# Patient Record
Sex: Female | Born: 1985 | Race: Black or African American | Hispanic: No | Marital: Married | Smoking: Never smoker
Health system: Southern US, Community
[De-identification: ages and names within clinical notes are randomized; demographics above are authoritative.]

## PROBLEM LIST (undated history)

## (undated) DIAGNOSIS — N390 Urinary tract infection, site not specified: Secondary | ICD-10-CM

## (undated) DIAGNOSIS — N760 Acute vaginitis: Secondary | ICD-10-CM

## (undated) DIAGNOSIS — B373 Candidiasis of vulva and vagina: Secondary | ICD-10-CM

## (undated) DIAGNOSIS — R51 Headache: Secondary | ICD-10-CM

## (undated) DIAGNOSIS — B9689 Other specified bacterial agents as the cause of diseases classified elsewhere: Secondary | ICD-10-CM

## (undated) DIAGNOSIS — B3731 Acute candidiasis of vulva and vagina: Secondary | ICD-10-CM

## (undated) HISTORY — PX: COLONOSCOPY: SHX174

## (undated) HISTORY — PX: NO PAST SURGERIES: SHX2092

---
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
## 2005-08-07 ENCOUNTER — Ambulatory Visit: Payer: Self-pay | Admitting: Family Medicine

## 2005-08-20 ENCOUNTER — Ambulatory Visit: Payer: Self-pay | Admitting: Family Medicine

## 2005-08-28 ENCOUNTER — Emergency Department (HOSPITAL_COMMUNITY): Admission: EM | Admit: 2005-08-28 | Discharge: 2005-08-29 | Payer: Self-pay | Admitting: Emergency Medicine

## 2005-10-12 ENCOUNTER — Other Ambulatory Visit: Admission: RE | Admit: 2005-10-12 | Discharge: 2005-10-12 | Payer: Self-pay | Admitting: Obstetrics and Gynecology

## 2006-01-16 ENCOUNTER — Inpatient Hospital Stay (HOSPITAL_COMMUNITY): Admission: AD | Admit: 2006-01-16 | Discharge: 2006-01-16 | Payer: Self-pay | Admitting: Obstetrics and Gynecology

## 2006-03-16 ENCOUNTER — Inpatient Hospital Stay (HOSPITAL_COMMUNITY): Admission: AD | Admit: 2006-03-16 | Discharge: 2006-03-16 | Payer: Self-pay | Admitting: Obstetrics and Gynecology

## 2006-03-19 ENCOUNTER — Inpatient Hospital Stay (HOSPITAL_COMMUNITY): Admission: AD | Admit: 2006-03-19 | Discharge: 2006-03-19 | Payer: Self-pay | Admitting: Obstetrics and Gynecology

## 2006-03-25 ENCOUNTER — Inpatient Hospital Stay (HOSPITAL_COMMUNITY): Admission: AD | Admit: 2006-03-25 | Discharge: 2006-03-27 | Payer: Self-pay | Admitting: Obstetrics and Gynecology

## 2006-08-30 ENCOUNTER — Emergency Department (HOSPITAL_COMMUNITY): Admission: EM | Admit: 2006-08-30 | Discharge: 2006-08-31 | Payer: Self-pay | Admitting: Emergency Medicine

## 2008-02-03 IMAGING — CR DG CHEST 2V
2 series · 2 of 2 positions shown · non-contrast
Comparison: None.

CLINICAL DATA: Third trimester pregnancy.  Headache, cough, chest pain, nausea, and vomiting.
 CHEST ? 2 VIEW:

[view not recorded (1 of 2)]
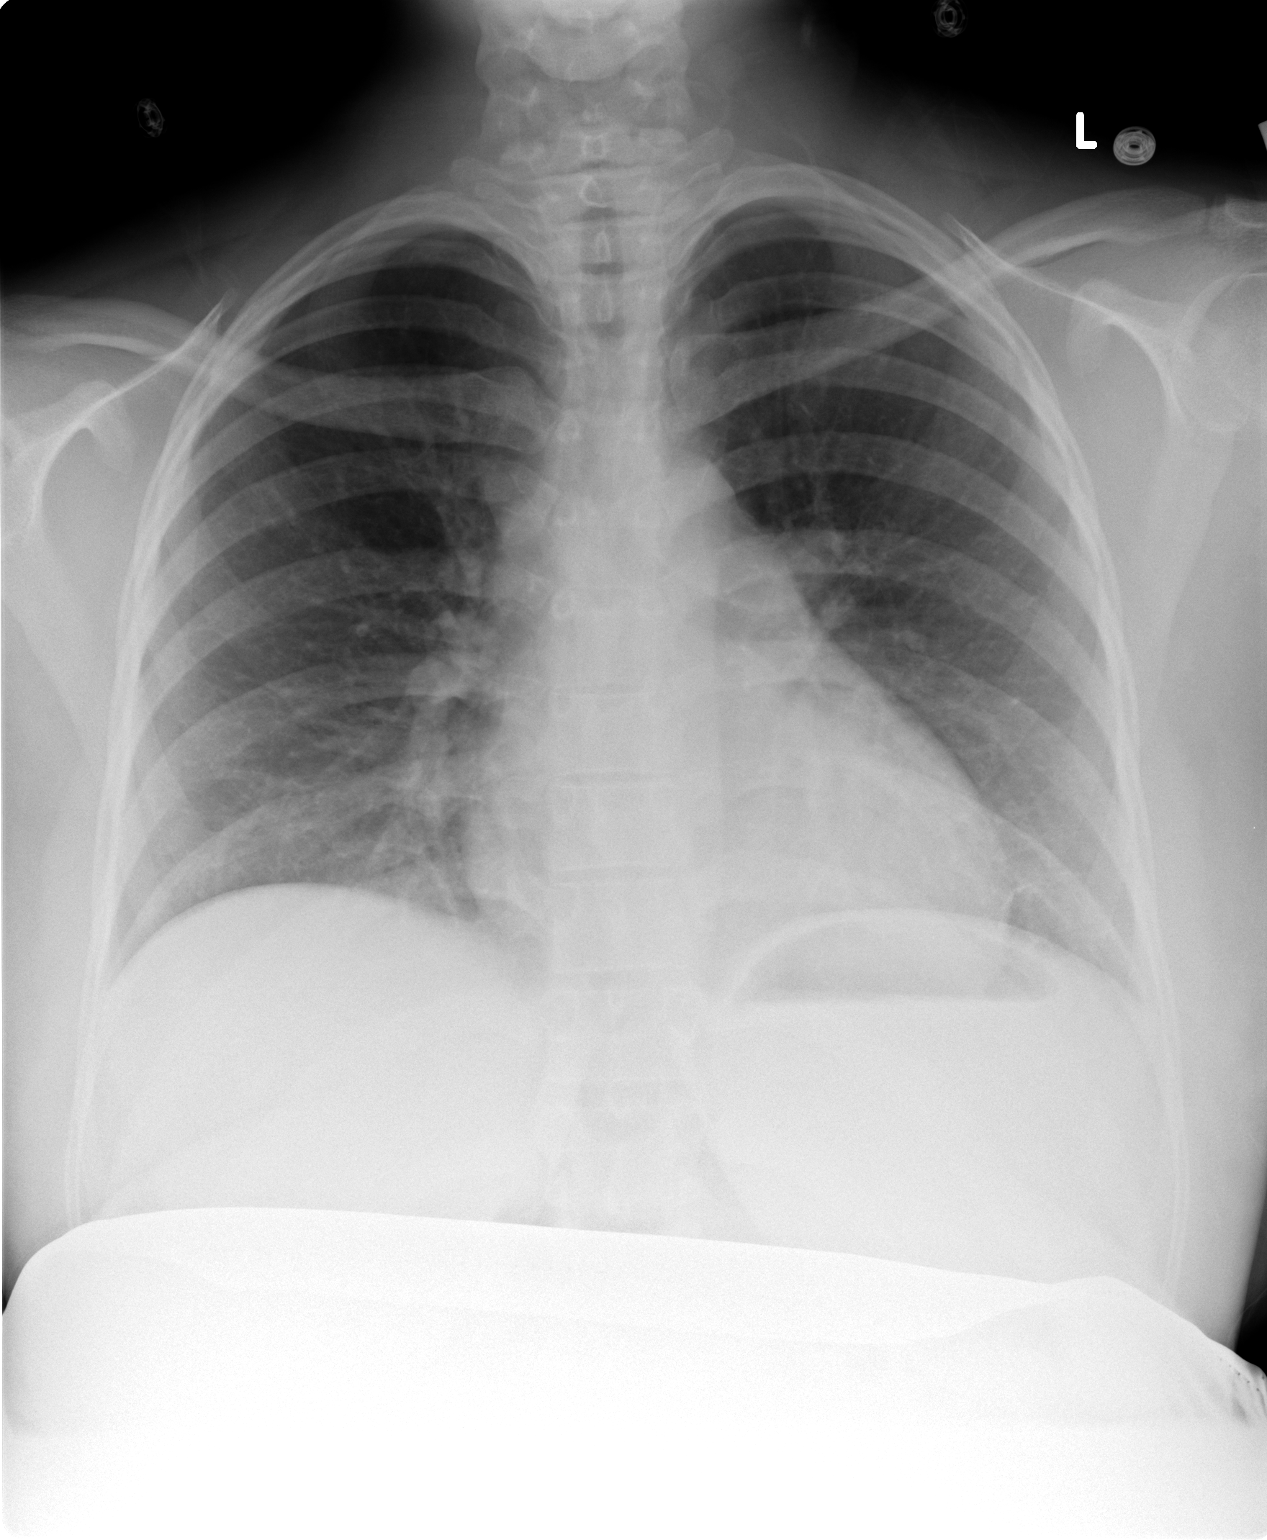

[view not recorded (2 of 2)]
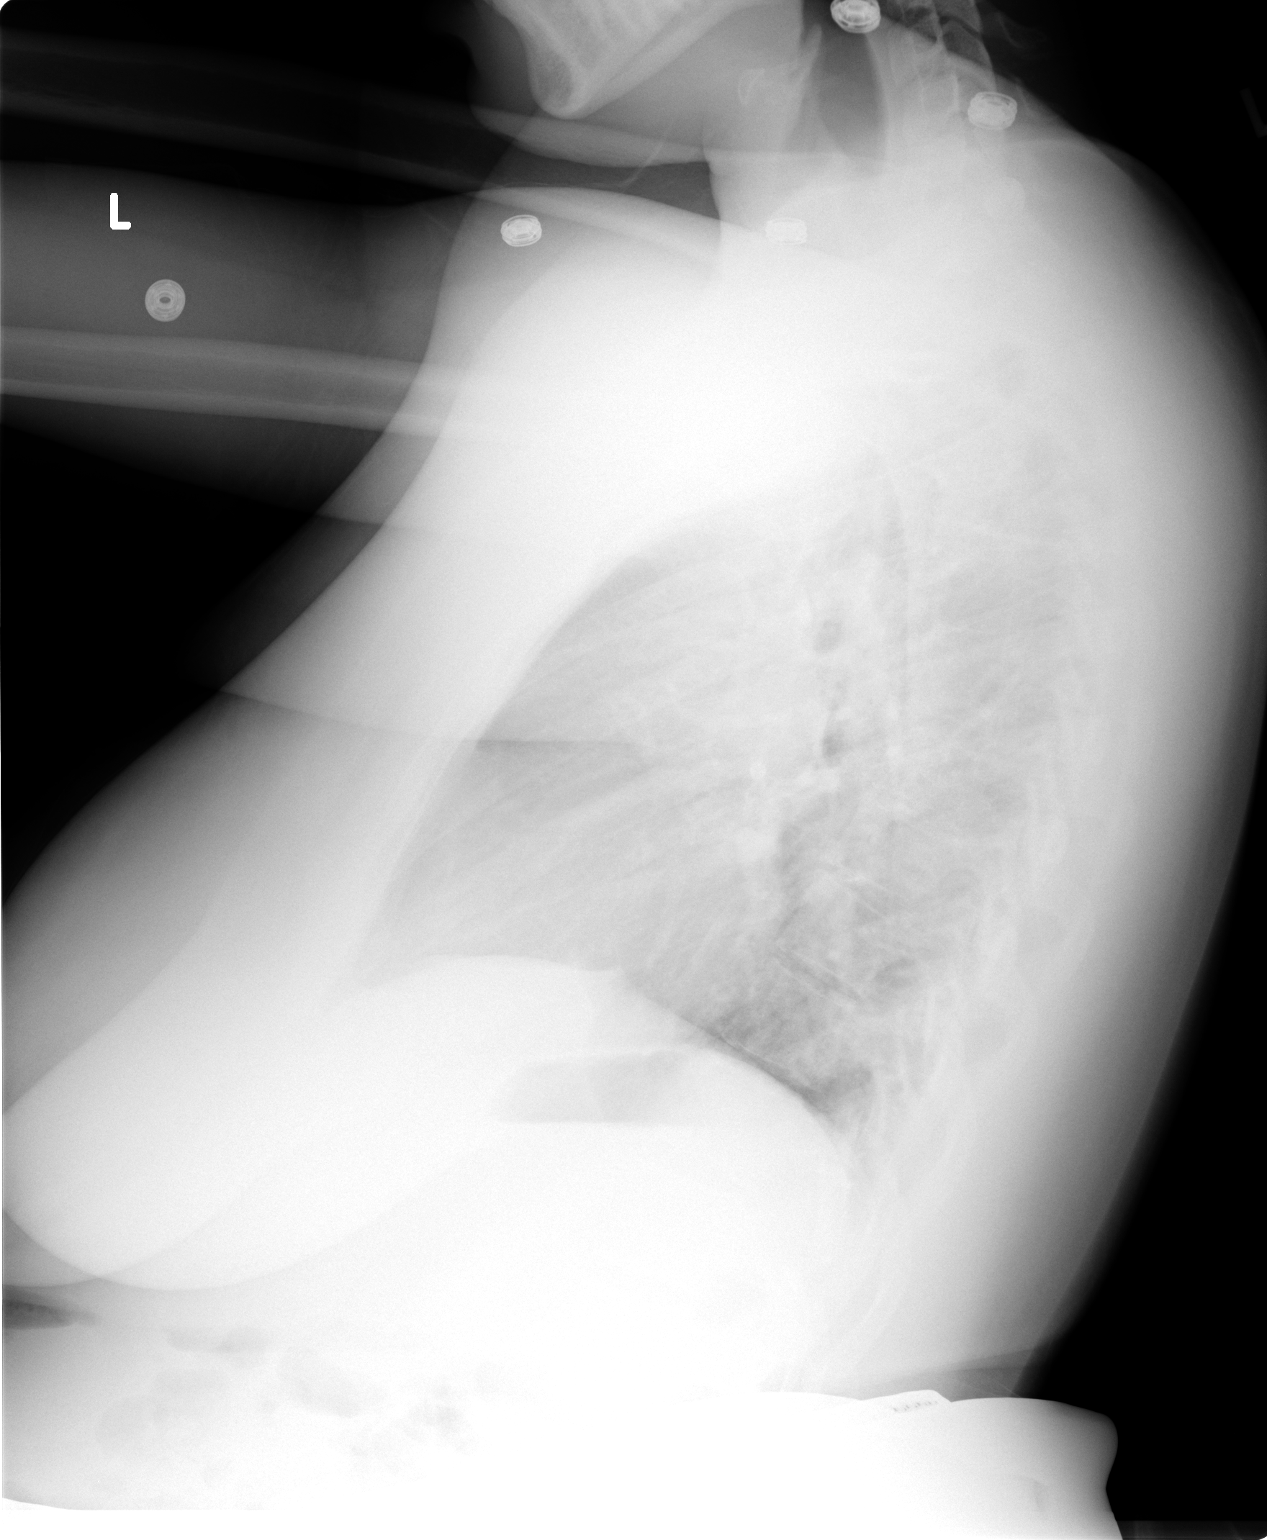

[2 of 2 positions shown; findings below may reference images not displayed]

FINDINGS: The patient was shielded for examination.  
 Heart size is normal.  The mediastinum is unremarkable.  The lungs are clear.  No effusions.  Bony structures appear normal.
IMPRESSION: Normal chest.

## 2008-12-21 ENCOUNTER — Emergency Department (HOSPITAL_COMMUNITY): Admission: EM | Admit: 2008-12-21 | Discharge: 2008-12-21 | Payer: Self-pay | Admitting: Emergency Medicine

## 2009-04-15 ENCOUNTER — Emergency Department (HOSPITAL_COMMUNITY): Admission: EM | Admit: 2009-04-15 | Discharge: 2009-04-15 | Payer: Self-pay | Admitting: Emergency Medicine

## 2009-04-17 ENCOUNTER — Emergency Department (HOSPITAL_COMMUNITY): Admission: EM | Admit: 2009-04-17 | Discharge: 2009-04-17 | Payer: Self-pay | Admitting: Emergency Medicine

## 2010-05-10 LAB — RAPID STREP SCREEN (MED CTR MEBANE ONLY): Streptococcus, Group A Screen (Direct): NEGATIVE

## 2010-05-24 LAB — RAPID STREP SCREEN (MED CTR MEBANE ONLY): Streptococcus, Group A Screen (Direct): NEGATIVE

## 2010-07-07 NOTE — H&P (Signed)
NAMESHYLA, GAYHEART             ACCOUNT NO.:  [id number]   MEDICAL RECORD NO.:  [id number]          PATIENT TYPE:  INP   LOCATION:  9198                          FACILITY:  [facility]   PHYSICIAN:  Crist Fat. Rivard, M.D. DATE OF BIRTH:  06-Dec-1985   DATE OF ADMISSION:  03/25/2006  DATE OF DISCHARGE:                              HISTORY & PHYSICAL   Ms. Donzetta Matters is a 25 year old gravida 1, para 0 at 38-1/7 weeks, EDD  April 07, 2006.  She presents to the office of [facility] with report of  contractions this morning, becoming stronger and more regular.  She  reports positive fetal movement.  No vaginal bleeding, no rupture of  membranes.  She denies any headache, visual changes or epigastric pain.  On exam at [facility], she is noted to be 4 cm dilated, 90% effaced with the  vertex at -1 station.  Spontaneous rupture of membranes occurred on exam  for clear fluid.  She is therefore sent to [facility] for admission in early labor with rupture of membranes.  Her  pregnancy has been followed by the C.N.M. service at [facility] and is  remarkable for  1. First trimester spotting.  2. GERD.  3. Group B strep negative.   This patient began prenatal care at the office of [facility] on August 27, 2005,  at approximately 11 weeks' gestation, [facility] determined by early pregnancy  ultrasound in the first trimester and confirmed with follow-up.  This  patient's pregnancy has been essentially unremarkable.  She has been  size equal to dates throughout, normotensive with no proteinuria.   PRENATAL LAB WORK:  On August 29, 2005, hemoglobin and hematocrit 12.4 and  36.8, platelets 358,000.  Blood type and Rh A+, antibody screen  negative, sickle cell trait negative, VDRL nonreactive, rubella immune,  hepatitis B surface antigen negative, HIV nonreactive.  CF testing  negative,  Pap smear within normal limits, GC and chlamydia negative at  28 weeks, 1-hour glucose challenge within normal limits at 36  weeks,  culture of the vaginal tract is negative for group B strep, GC and  chlamydia.   OBSTETRIC HISTORY:  The current pregnancy.   MEDICAL HISTORY:  Significant for gastric reflux.   FAMILY HISTORY:  Maternal grandparents and mother with a history of  chronic hypertension.  Maternal grandfather with a history of diabetes.  Maternal grandfather chronic kidney disease on dialysis.  Maternal  grandmother stroke and seizures.   GENETIC HISTORY:  The patient's mother with a heart murmur.  Otherwise  there is no family history of familial or chromosomal disorders,  children that died in infancy or any that were born with birth defects.   ALLERGIES:  The patient has no known drug allergies.   She denies the use of tobacco, alcohol or illicit drugs.   REVIEW OF SYSTEMS:  As described above.  The patient is typical of one  with a uterine pregnancy at term in early active labor with rupture of  membranes.   PHYSICAL EXAMINATION:  VITAL SIGNS:  Stable, afebrile.  HEENT:  Unremarkable.  HEART:  Regular rate and  rhythm.  LUNGS:  Clear.  ABDOMEN:  Gravid in its contour.  Uterine fundus is noted to extend 38  cm above the level of the pubic symphysis.  Leopold's maneuvers find the  infant to be in a longitudinal lie, cephalic presentation and the  estimated fetal weight is 7 pounds, fetal heart rate is 150s by Doppler.  Abdomen is soft and nontender.  CERVIX:  Digital exam of the cervix finds it to be 4 cm dilated, 90%  effaced with the cephalic presenting part at a -1 station.  Spontaneous  rupture of membranes occurred on exam for clear fluid.  EXTREMITIES:  No pathologic edema.  DTRs were 1+ with no clonus.   ASSESSMENT:  1. Intrauterine pregnancy at 38-1/7 weeks.  2. Early labor.  3. Spontaneous rupture of membranes, clear fluid.   PLAN:  Admit to [facility] for the C.N.M. service  with routine orders.  Anticipate spontaneous vaginal delivery.      Rica Koyanagi, C.N.M.      Crist Fat Rivard, M.D.  Electronically Signed    SDM/MEDQ  D:  03/25/2006  T:  03/25/2006  Job:  [id number]

## 2010-12-05 LAB — URINALYSIS, ROUTINE W REFLEX MICROSCOPIC
Bilirubin Urine: NEGATIVE
Ketones, ur: NEGATIVE
Nitrite: NEGATIVE
Protein, ur: NEGATIVE
pH: 6

## 2010-12-05 LAB — DIFFERENTIAL
Basophils Absolute: 0.2 — ABNORMAL HIGH
Eosinophils Absolute: 0.1
Lymphs Abs: 2.1
Monocytes Absolute: 0.8 — ABNORMAL HIGH
Monocytes Relative: 9

## 2010-12-05 LAB — CBC
HCT: 34.5 — ABNORMAL LOW
RDW: 14.6 — ABNORMAL HIGH

## 2010-12-05 LAB — BASIC METABOLIC PANEL
BUN: 13
CO2: 26
Chloride: 106
Creatinine, Ser: 0.79
Potassium: 3.9

## 2010-12-05 LAB — PREGNANCY, URINE: Preg Test, Ur: NEGATIVE

## 2011-01-10 ENCOUNTER — Encounter: Payer: Self-pay | Admitting: Emergency Medicine

## 2011-01-10 ENCOUNTER — Emergency Department (HOSPITAL_COMMUNITY): Payer: PRIVATE HEALTH INSURANCE

## 2011-01-10 ENCOUNTER — Emergency Department (HOSPITAL_COMMUNITY)
Admission: EM | Admit: 2011-01-10 | Discharge: 2011-01-10 | Disposition: A | Discharge status: Home / Self Care | Payer: PRIVATE HEALTH INSURANCE | Attending: Emergency Medicine | Admitting: Emergency Medicine

## 2011-01-10 DIAGNOSIS — R059 Cough, unspecified: Secondary | ICD-10-CM | POA: Insufficient documentation

## 2011-01-10 DIAGNOSIS — R05 Cough: Secondary | ICD-10-CM | POA: Insufficient documentation

## 2011-01-10 DIAGNOSIS — R070 Pain in throat: Secondary | ICD-10-CM | POA: Insufficient documentation

## 2011-01-10 DIAGNOSIS — R509 Fever, unspecified: Secondary | ICD-10-CM | POA: Insufficient documentation

## 2011-01-10 DIAGNOSIS — R52 Pain, unspecified: Secondary | ICD-10-CM | POA: Insufficient documentation

## 2011-01-10 DIAGNOSIS — J069 Acute upper respiratory infection, unspecified: Secondary | ICD-10-CM | POA: Insufficient documentation

## 2011-01-10 MED ORDER — IBUPROFEN 800 MG PO TABS: 800.0000 mg | ORAL_TABLET | Freq: Three times a day (TID) | ORAL | Status: AC

## 2011-01-10 NOTE — ED Provider Notes (Signed)
History     CSN: [id number] Arrival date & time: 01/10/2011  1:08 PM   First MD Initiated Contact with Patient 01/10/11 1507      No chief complaint on file.   (Consider location/radiation/quality/duration/timing/severity/associated sxs/prior treatment) HPI History provided by pt.   Pt has had a painful, non-productive cough for the past 3 days.  Associated w/ fever, max temp 102, chills, bodyaches, generalized weakness, anorexia, nasal congestion, ear/throat pain.  Patient's daughter diagnosed w/ CAP this week.  Pt has no PMH.    History reviewed. No pertinent past medical history.  History reviewed. No pertinent past surgical history.  Family History  Problem Relation Age of Onset  . Asthma Mother   . Diabetes Mother   . Hypertension Mother   . Stroke Mother     History  Substance Use Topics  . Smoking status: Never Smoker   . Smokeless tobacco: Not on file  . Alcohol Use: No    OB History    Grav Para Term Preterm Abortions TAB SAB Ect Mult Living                  Review of Systems  All other systems reviewed and are negative.    Allergies  Review of patient's allergies indicates no known allergies.  Home Medications   Current Outpatient Rx  Name Route Sig Dispense Refill  . GUAIFENESIN-CODEINE 100-10 MG/5ML PO SYRP Oral Take 10 mLs by mouth 4 (four) times daily as needed. cough     . IBUPROFEN 600 MG PO TABS Oral Take 600 mg by mouth every 6 (six) hours as needed. pain     . ORTHO TRI-CYCLEN LO PO Oral Take 1 tablet by mouth daily.      Marland Kitchen VITAMIN C 500 MG PO TABS Oral Take 500 mg by mouth daily.        BP 114/79  Pulse 106  Temp 98.9 F (37.2 C)  Resp 18  SpO2 99%  LMP 01/04/2011  Physical Exam  Nursing note and vitals reviewed. Constitutional: She is oriented to person, place, and time. She appears well-developed and well-nourished. No distress.  HENT:  Head: No trismus in the jaw.  Right Ear: Tympanic membrane, external ear and ear canal  normal.  Left Ear: Tympanic membrane, external ear and ear canal normal.  Mouth/Throat: Uvula is midline and mucous membranes are normal. No oropharyngeal exudate, posterior oropharyngeal edema or posterior oropharyngeal erythema.  Eyes:       Normal appearance  Neck: Normal range of motion. Neck supple.  Cardiovascular: Normal rate and regular rhythm.   Pulmonary/Chest: Effort normal and breath sounds normal.  Lymphadenopathy:    She has cervical adenopathy.  Neurological: She is alert and oriented to person, place, and time.  Skin: Skin is warm and dry. No rash noted.  Psychiatric: She has a normal mood and affect. Her behavior is normal.    ED Course  Procedures (including critical care time)  Labs Reviewed - No data to display Dg Chest 2 View  01/10/2011  *RADIOLOGY REPORT*  Clinical Data: Cough, chest pain, fever.  CHEST - 2 VIEW  Comparison: 04/17/2009  Findings: Heart and mediastinal contours are within normal limits. No focal opacities or effusions.  No acute bony abnormality.  IMPRESSION: No active cardiopulmonary disease.  Original Report Authenticated By: Cyndie Chime, M.D.     1. Upper respiratory tract infection       MDM  Healthy 25yo F presents w/ URI.  CXR ordered  b/c CAP exposure at home and negative.  Results discussed w/ pt.  She has an albuterol inhaler at home for cough.  Recommended ibuprofen, sudafed, rest and hydration.  She has a PCP to f/u with.          Otilio Miu, [place] 01/11/11 1049

## 2011-01-10 NOTE — ED Notes (Signed)
Patient here with general bodyaches, weakness, ear pain x 3 days. No distress

## 2011-01-11 NOTE — ED Provider Notes (Signed)
Medical screening examination/treatment/procedure(s) were conducted as a shared visit with non-physician practitioner(s) and myself.  I personally evaluated the patient during the encounter    Nelia Shi, MD 01/11/11 1105

## 2011-08-02 ENCOUNTER — Emergency Department (HOSPITAL_COMMUNITY)
Admission: EM | Admit: 2011-08-02 | Discharge: 2011-08-03 | Disposition: A | Discharge status: Home / Self Care | Payer: PRIVATE HEALTH INSURANCE | Attending: Emergency Medicine | Admitting: Emergency Medicine

## 2011-08-02 ENCOUNTER — Encounter (HOSPITAL_COMMUNITY): Payer: Self-pay

## 2011-08-02 DIAGNOSIS — R252 Cramp and spasm: Secondary | ICD-10-CM

## 2011-08-02 DIAGNOSIS — M79609 Pain in unspecified limb: Secondary | ICD-10-CM | POA: Insufficient documentation

## 2011-08-02 NOTE — ED Notes (Signed)
Pt c/o leg "cramps" for 3 days. Started in calf and is radiating into thigh. Pt MAE randomly.

## 2011-08-03 LAB — BASIC METABOLIC PANEL
BUN: 14 mg/dL (ref 6–23)
CO2: 25 mEq/L (ref 19–32)
Calcium: 9.4 mg/dL (ref 8.4–10.5)
Chloride: 102 mEq/L (ref 96–112)
GFR calc Af Amer: >90 mL/min (ref >90)
GFR calc non Af Amer: >90 mL/min (ref >90)

## 2011-08-03 MED ORDER — METHOCARBAMOL 500 MG PO TABS: 500.0000 mg | ORAL_TABLET | Freq: Two times a day (BID) | ORAL | Status: AC

## 2011-08-03 MED ORDER — IBUPROFEN 600 MG PO TABS: 600.0000 mg | ORAL_TABLET | Freq: Four times a day (QID) | ORAL | Status: AC | PRN

## 2011-08-03 NOTE — Discharge Instructions (Signed)
Leg Cramps  Leg cramps that occur during exercise can be caused by poor circulation or dehydration. However, muscle cramps that occur at rest or during the night are usually not due to any serious medical problem. Heat cramps may cause muscle spasms during hot weather.   CAUSES  There is no clear cause for muscle cramps. However, dehydration may be a factor for those who do not drink enough fluids and those who exercise in the heat. Imbalances in the level of sodium, potassium, calcium or magnesium in the muscle tissue may also be a factor. Some medications, such as water pills (diuretics), may cause loss of chemicals that the body needs (like sodium and potassium) and cause muscle cramps.  TREATMENT   · Make sure your diet has enough fluids and essential minerals for the muscle to work normally.  · Avoid strenuous exercise for several days if you have been having frequent leg cramps.  · Stretch and massage the cramped muscle for several minutes.  · Some medicines may be helpful in some patients with night cramps. Only take over-the-counter or prescription medicines as directed by your caregiver.  SEEK IMMEDIATE MEDICAL CARE IF:   · Your leg cramps become worse.  · Your foot becomes cold, numb, or blue.  Document Released: 03/15/2004 Document Revised: 01/25/2011 Document Reviewed: 03/02/2008  ExitCare® Patient Information ©2012 [employer].

## 2011-08-03 NOTE — ED Provider Notes (Signed)
History     CSN: [id number]  Arrival date & time 08/02/11  1925   First MD Initiated Contact with Patient 08/02/11 2240      Chief Complaint  Patient presents with  . Leg Pain    (Consider location/radiation/quality/duration/timing/severity/associated sxs/prior treatment) HPI Comments: Patient reports that she has ben having intermittent muscle cramps of her right lower leg for the past 3 days.  Cramping also present in the right thigh.  She has not noticed any swelling or erythema.  No prior history of DVT or PE.  No prolonged travel or surgery in the past 4 weeks.  She is on Ortho-Tri Cyclen Lo.    The history is provided by the patient.    No past medical history on file.  No past surgical history on file.  Family History  Problem Relation Age of Onset  . Asthma Mother   . Diabetes Mother   . Hypertension Mother   . Stroke Mother     History  Substance Use Topics  . Smoking status: Never Smoker   . Smokeless tobacco: Not on file  . Alcohol Use: No    OB History    Grav Para Term Preterm Abortions TAB SAB Ect Mult Living                  Review of Systems  Constitutional: Negative for fever and chills.  Respiratory: Negative for shortness of breath.   Cardiovascular: Negative for chest pain and leg swelling.  Musculoskeletal: Negative for joint swelling and gait problem.  Skin: Negative for color change and wound.  Neurological: Negative for numbness.    Allergies  Review of patient's allergies indicates no known allergies.  Home Medications   Current Outpatient Rx  Name Route Sig Dispense Refill  . ORTHO TRI-CYCLEN LO PO Oral Take 1 tablet by mouth daily.        BP 108/74  Pulse 97  Temp 98.5 F (36.9 C) (Oral)  Resp 18  Wt 192 lb 8 oz (87.317 kg)  SpO2 100%  LMP 07/19/2011  Physical Exam  Nursing note and vitals reviewed. Constitutional: She appears well-developed and well-nourished. No distress.  HENT:  Head: Normocephalic and  atraumatic.  Mouth/Throat: Oropharynx is clear and moist.  Neck: Normal range of motion. Neck supple.  Cardiovascular: Normal rate, regular rhythm and normal heart sounds.   Pulmonary/Chest: Effort normal and breath sounds normal.  Musculoskeletal: Normal range of motion. She exhibits no edema and no tenderness.       Negative Homan's sign  Neurological: She is alert. She has normal strength. No sensory deficit. Gait normal.  Skin: Skin is warm and dry. She is not diaphoretic. No erythema.       No erythema, edema, or warmth. No palpable cords.  Psychiatric: She has a normal mood and affect.    ED Course  Procedures (including critical care time)   Labs Reviewed  BASIC METABOLIC PANEL   No results found.   No diagnosis found.    MDM  Patient with intermittent muscle cramps of right leg.  No erythema or edema.  Labs unremarkable.  Feel patient can be discharged home.  Return precautions discussed.        Pascal Lux [place], PA-C 08/03/11 2028

## 2011-08-06 NOTE — ED Provider Notes (Signed)
Medical screening examination/treatment/procedure(s) were performed by non-physician practitioner and as supervising physician I was immediately available for consultation/collaboration.   Vickie Elliott L Charlestine Rookstool, MD 08/06/11 0223 

## 2011-10-04 ENCOUNTER — Encounter (HOSPITAL_COMMUNITY): Payer: Self-pay | Admitting: Emergency Medicine

## 2011-10-04 ENCOUNTER — Emergency Department (INDEPENDENT_AMBULATORY_CARE_PROVIDER_SITE_OTHER)
Admission: EM | Admit: 2011-10-04 | Discharge: 2011-10-04 | Disposition: A | Discharge status: Home / Self Care | Payer: Self-pay | Source: Home / Self Care | Attending: Emergency Medicine | Admitting: Emergency Medicine

## 2011-10-04 DIAGNOSIS — R300 Dysuria: Secondary | ICD-10-CM

## 2011-10-04 DIAGNOSIS — N760 Acute vaginitis: Secondary | ICD-10-CM

## 2011-10-04 HISTORY — DX: Other specified bacterial agents as the cause of diseases classified elsewhere: N76.0

## 2011-10-04 HISTORY — DX: Acute candidiasis of vulva and vagina: B37.31

## 2011-10-04 HISTORY — DX: Other specified bacterial agents as the cause of diseases classified elsewhere: B96.89

## 2011-10-04 HISTORY — DX: Urinary tract infection, site not specified: N39.0

## 2011-10-04 HISTORY — DX: Candidiasis of vulva and vagina: B37.3

## 2011-10-04 LAB — POCT URINALYSIS DIP (DEVICE)
Glucose, UA: NEGATIVE mg/dL
Ketones, ur: NEGATIVE mg/dL
Nitrite: NEGATIVE
Specific Gravity, Urine: 1.02 (ref 1.005–1.030)
Urobilinogen, UA: 1 mg/dL (ref 0.0–1.0)

## 2011-10-04 LAB — POCT PREGNANCY, URINE: Preg Test, Ur: NEGATIVE

## 2011-10-04 MED ORDER — PHENAZOPYRIDINE HCL 200 MG PO TABS: 200.0000 mg | ORAL_TABLET | Freq: Three times a day (TID) | ORAL | Status: AC | PRN

## 2011-10-04 MED ORDER — FLUCONAZOLE 150 MG PO TABS: ORAL_TABLET | ORAL | Status: AC

## 2011-10-04 NOTE — ED Provider Notes (Signed)
History     CSN: [id number]  Arrival date & time 10/04/11  1128   First MD Initiated Contact with Patient 10/04/11 1137      Chief Complaint  Patient presents with  . Abdominal Pain    (Consider location/radiation/quality/duration/timing/severity/associated sxs/prior treatment) HPI Comments: Patient reports urinary urgency, frequency, dysuria starting about 4 days ago. Also reports mild pelvic pain, back pain. States that she is using a contraceptive sponge with her husband, which is a new form of birth control for her. States that the sponge broke, and they had difficulty removing it in its entirety, , which happened 5 days ago. No aggravating or alleviating factors for her symptoms. She has not tried anything for this. She denies any vaginal bleeding or discharge. No genital rash, vaginal odor. Some nausea, no vomiting. No abdominal distention. No change in bowel habit. History of UTI, BV and yeast infections. No history of gonorrhea, Chlamydia, herpes, HIV, syphilis. STDs not a concern today.  ROS as noted in HPI. All other ROS negative.   Patient is a 26 y.o. female presenting with abdominal pain. The history is provided by the patient. No language interpreter was used.  Abdominal Pain The primary symptoms of the illness include abdominal pain. The current episode started more than 2 days ago. The onset of the illness was sudden. The problem has not changed since onset. The abdominal pain is located in the suprapubic region. The abdominal pain radiates to the back.  The patient states that she believes she is currently not pregnant. The patient has not had a change in bowel habit. Additional symptoms associated with the illness include urgency, frequency and back pain. Symptoms associated with the illness do not include anorexia, diaphoresis, constipation or hematuria.    Past Medical History  Diagnosis Date  . Urinary tract infection   . BV (bacterial vaginosis)   . Vaginal yeast  infection     History reviewed. No pertinent past surgical history.  Family History  Problem Relation Age of Onset  . Asthma Mother   . Diabetes Mother   . Hypertension Mother   . Stroke Mother     History  Substance Use Topics  . Smoking status: Never Smoker   . Smokeless tobacco: Not on file  . Alcohol Use: No    OB History    Grav Para Term Preterm Abortions TAB SAB Ect Mult Living                  Review of Systems  Constitutional: Negative for diaphoresis.  Gastrointestinal: Positive for abdominal pain. Negative for constipation and anorexia.  Genitourinary: Positive for urgency and frequency. Negative for hematuria.  Musculoskeletal: Positive for back pain.    Allergies  Review of patient's allergies indicates no known allergies.  Home Medications   Current Outpatient Rx  Name Route Sig Dispense Refill  . FLUCONAZOLE 150 MG PO TABS  1 tab po x 1. May repeat in 72 hours if no improvement 2 tablet 0  . PHENAZOPYRIDINE HCL 200 MG PO TABS Oral Take 1 tablet (200 mg total) by mouth 3 (three) times daily as needed for pain. 6 tablet 0    BP 94/66  Pulse 83  Temp 97.8 F (36.6 C) (Oral)  Resp 18  SpO2 99%  LMP 09/17/2011  Physical Exam  Nursing note and vitals reviewed. Constitutional: She is oriented to person, place, and time. She appears well-developed and well-nourished. No distress.  HENT:  Head: Normocephalic and atraumatic.  Eyes:  EOM are normal.  Neck: Normal range of motion.  Cardiovascular: Normal rate.   Pulmonary/Chest: Effort normal.  Abdominal: Soft. Normal appearance and bowel sounds are normal. She exhibits no distension. There is tenderness in the suprapubic area. There is no rebound, no guarding and no CVA tenderness.  Genitourinary: Pelvic exam was performed with patient supine. There is no rash on the right labia. There is no rash on the left labia. Uterus is tender. Cervix exhibits no motion tenderness and no friability. Right adnexum  displays no mass, no tenderness and no fullness. Left adnexum displays no mass, no tenderness and no fullness. No erythema, tenderness or bleeding around the vagina. No foreign body around the vagina. Vaginal discharge found.       Thick white nonoderous  vaginal d/c. Friable cervix. No retained foreign bodies Chaperone present during exam  Musculoskeletal: Normal range of motion.  Neurological: She is alert and oriented to person, place, and time.  Skin: Skin is warm and dry.  Psychiatric: She has a normal mood and affect. Her behavior is normal. Judgment and thought content normal.    ED Course  Procedures (including critical care time)  Labs Reviewed  POCT URINALYSIS DIP (DEVICE) - Abnormal; Notable for the following:    Hgb urine dipstick TRACE (*)     All other components within normal limits  POCT PREGNANCY, URINE  WET PREP, GENITAL  GC/CHLAMYDIA PROBE AMP, GENITAL   No results found.   1. Vaginitis   2. Dysuria     MDM  Udip negative for UTI. H&P most consistent with an inflammatory vaginitis/cervicitis from the birth control sponge. No retained foreign bodies noted. Has extensive thick, white nonodorous vaginal discharge suggestive of a yeast infection. Believe that her urinary symptoms are secondary to the GYN irritation. Doubt PID, STI's. Sending home with Pyridium for urgency, frequency, in Diflucan. She'll use a working phone number so that we can call in Flagyl are other medications as indicated. Patient is to give [place] a working phone number. She will refrain from intercourse until her symptoms have resolved. She is to followup with her primary care physician as needed. Discussed signs and symptoms that should prompt a return. Patient agrees with plan.  Luiz Blare, MD 10/04/11 1256

## 2011-10-04 NOTE — ED Notes (Signed)
C/o abdominal pain, lower abdomen.  Also c/o lower back pain.  Also c/o nausea, painful urination and frequency.  Onset 8/11.  Denies vaginal discharge.

## 2011-10-05 LAB — GC/CHLAMYDIA PROBE AMP, GENITAL: Chlamydia, DNA Probe: NEGATIVE

## 2012-11-21 ENCOUNTER — Other Ambulatory Visit: Payer: Self-pay | Admitting: Obstetrics and Gynecology

## 2012-12-09 ENCOUNTER — Encounter (HOSPITAL_COMMUNITY): Payer: Self-pay | Admitting: Pharmacist

## 2012-12-09 ENCOUNTER — Other Ambulatory Visit: Payer: Self-pay | Admitting: Obstetrics and Gynecology

## 2012-12-15 ENCOUNTER — Other Ambulatory Visit: Payer: Self-pay | Admitting: Obstetrics and Gynecology

## 2012-12-16 ENCOUNTER — Encounter (HOSPITAL_COMMUNITY): Payer: Self-pay

## 2012-12-16 ENCOUNTER — Encounter (HOSPITAL_COMMUNITY)
Admission: RE | Admit: 2012-12-16 | Discharge: 2012-12-16 | Disposition: A | Discharge status: Home / Self Care | Payer: BC Managed Care – PPO | Source: Ambulatory Visit | Attending: Obstetrics and Gynecology | Admitting: Obstetrics and Gynecology

## 2012-12-16 DIAGNOSIS — Z01812 Encounter for preprocedural laboratory examination: Secondary | ICD-10-CM | POA: Insufficient documentation

## 2012-12-16 DIAGNOSIS — Z01818 Encounter for other preprocedural examination: Secondary | ICD-10-CM | POA: Insufficient documentation

## 2012-12-16 HISTORY — DX: Headache: R51

## 2012-12-16 LAB — CBC
HCT: 38.2 % (ref 36.0–46.0)
Hemoglobin: 13.1 g/dL (ref 12.0–15.0)
MCV: 88 fL (ref 78.0–100.0)
RBC: 4.34 MIL/uL (ref 3.87–5.11)
RDW: 13 % (ref 11.5–15.5)
WBC: 8 10*3/uL (ref 4.0–10.5)

## 2012-12-16 NOTE — Patient Instructions (Signed)
Your procedure is scheduled on: 12/23/2012  Enter through the Main Entrance of [facility] at: [id number]  Pick up the phone at the desk and dial [phone].  Call this number if you have problems the morning of surgery: [phone].  Remember: Do NOT eat food: AFTER MIDNIGHT 12/22/2012 Do NOT drink clear liquids after: AFTER MIDNIGHT 12/22/2012 Take these medicines the morning of surgery with a SIP OF WATER: NONE  Do NOT wear jewelry (body piercing), make-up, or nail polish. Do NOT wear lotions, powders, or perfumes.  You may wear deoderant. Do NOT shave for 48 hours prior to surgery. Do NOT bring valuables to the hospital. Contacts, dentures, or bridgework may not be worn into surgery.  Have a responsible adult drive you home and stay with you for 24 hours after your procedure

## 2012-12-22 NOTE — H&P (Signed)
Vickie Elliott is an 27 y.o. female. who presents for laparoscopy to evaluate increasing dysmenorrhea  Pertinent Gynecological History: Menses: flow is excessive with use of both  pads or tampons on heaviest days, usually lasting 2 to 3 days and with severe dysmenorrhea Bleeding: no intermenstrual bleeding Contraception: Has used  IUD, NuvaRing vaginal inserts and OCP (estrogen/progesterone) in past, now using OCPs Blood transfusions: none Sexually transmitted diseases: no past history Previous GYN Procedures: None  Last pap: abnormal: ASCUS pos HR HPV Date: 10/2012 OB History: G1, P1   Menstrual History: Menarche age: 57  lmp 11/22/12   Past Medical History  Diagnosis Date  . Urinary tract infection   . BV (bacterial vaginosis)   . Vaginal yeast infection   . Headache(784.0)     migraines    Past Surgical History  Procedure Laterality Date  . No past surgeries      Family History  Problem Relation Age of Onset  . Diabetes Mother   . Hypertension Mother   . Asthma Sister     Social History:  reports that she has never smoked. She does not have any smokeless tobacco history on file. She reports that she drinks alcohol. She reports that she does not use illicit drugs.  Allergies: No Known Allergies  Prescriptions prior to admission  Medication Sig Dispense Refill  . norgestrel-ethinyl estradiol (LO/OVRAL,CRYSELLE) 0.3-30 MG-MCG tablet Take 1 tablet by mouth daily.        Review of Systems  Constitutional: Negative.   Respiratory: Negative.   Cardiovascular: Negative.   Gastrointestinal: Positive for abdominal pain.  Skin: Negative.     Blood pressure 120/75, pulse 100, temperature 98.2 F (36.8 C), temperature source Oral, resp. rate 18, height 5\' 4"  (1.626 m), weight 198 lb (89.812 kg), SpO2 100.00%. Physical Exam  Constitutional: She is oriented to person, place, and time. She appears well-developed and well-nourished.  HENT:  Head: Normocephalic and atraumatic.   Eyes: EOM are normal.  Neck: Normal range of motion. Neck supple.  Cardiovascular: Normal rate.   Genitourinary: Vagina normal and uterus normal.  Neurological: She is alert and oriented to person, place, and time.  Skin: Skin is warm and dry.    Results for orders placed during the hospital encounter of 12/23/12 (from the past 24 hour(s))  PREGNANCY, URINE     Status: None   Collection Time    12/23/12 10:55 AM      Result Value Range   Preg Test, Ur NEGATIVE  NEGATIVE    No results found.  Assessment/Plan: Pelvic pain and dysmenorrhea suggestive of endometriosis Minimal improvement in symptoms with areas hormonal containing contraceptives  Options have been reviewed with the patient and she has decided she wishes to proceed with a diagnostic laparoscopy. The risks of anesthesia bleeding infection damage to adjacent organs and the possibility that no specific cause for her pain will be found all been discussed with the patient and she wishes to proceed  Apostolos Blagg P 12/23/2012, 1:41 PM

## 2012-12-23 ENCOUNTER — Ambulatory Visit (HOSPITAL_COMMUNITY)
Admission: RE | Admit: 2012-12-23 | Discharge: 2012-12-23 | Disposition: A | Discharge status: Home / Self Care | Payer: BC Managed Care – PPO | Source: Ambulatory Visit | Attending: Obstetrics and Gynecology | Admitting: Obstetrics and Gynecology

## 2012-12-23 ENCOUNTER — Encounter (HOSPITAL_COMMUNITY)
Admission: RE | Disposition: A | Discharge status: Home / Self Care | Payer: Self-pay | Source: Ambulatory Visit | Attending: Obstetrics and Gynecology

## 2012-12-23 ENCOUNTER — Encounter (HOSPITAL_COMMUNITY): Payer: Self-pay | Admitting: Anesthesiology

## 2012-12-23 ENCOUNTER — Encounter (HOSPITAL_COMMUNITY): Payer: BC Managed Care – PPO | Admitting: Certified Registered Nurse Anesthetist

## 2012-12-23 ENCOUNTER — Ambulatory Visit (HOSPITAL_COMMUNITY): Payer: BC Managed Care – PPO | Admitting: Certified Registered Nurse Anesthetist

## 2012-12-23 DIAGNOSIS — N80109 Endometriosis of ovary, unspecified side, unspecified depth: Secondary | ICD-10-CM | POA: Insufficient documentation

## 2012-12-23 DIAGNOSIS — N801 Endometriosis of ovary: Secondary | ICD-10-CM | POA: Insufficient documentation

## 2012-12-23 DIAGNOSIS — N946 Dysmenorrhea, unspecified: Secondary | ICD-10-CM | POA: Insufficient documentation

## 2012-12-23 DIAGNOSIS — N803 Endometriosis of pelvic peritoneum, unspecified: Secondary | ICD-10-CM | POA: Insufficient documentation

## 2012-12-23 DIAGNOSIS — N808 Other endometriosis: Secondary | ICD-10-CM | POA: Diagnosis present

## 2012-12-23 DIAGNOSIS — N949 Unspecified condition associated with female genital organs and menstrual cycle: Secondary | ICD-10-CM | POA: Insufficient documentation

## 2012-12-23 DIAGNOSIS — IMO0002 Reserved for concepts with insufficient information to code with codable children: Secondary | ICD-10-CM | POA: Insufficient documentation

## 2012-12-23 HISTORY — PX: LAPAROSCOPY: SHX197

## 2012-12-23 SURGERY — LAPAROSCOPY OPERATIVE
Anesthesia: General | Site: Abdomen | Wound class: Clean Contaminated

## 2012-12-23 MED ORDER — BUPIVACAINE HCL (PF) 0.25 % IJ SOLN
INTRAMUSCULAR | Status: AC
  Filled 2012-12-23: qty 30

## 2012-12-23 MED ORDER — BUPIVACAINE HCL (PF) 0.25 % IJ SOLN
INTRAMUSCULAR | Status: DC | PRN
  Administered 2012-12-23: 30 mL

## 2012-12-23 MED ORDER — HYDROCODONE-ACETAMINOPHEN 5-325 MG PO TABS
1.0000 | ORAL_TABLET | Freq: Once | ORAL | Status: AC
  Administered 2012-12-23: 1 via ORAL

## 2012-12-23 MED ORDER — KETOROLAC TROMETHAMINE 30 MG/ML IJ SOLN
INTRAMUSCULAR | Status: AC
  Filled 2012-12-23: qty 1

## 2012-12-23 MED ORDER — HYDROCODONE-ACETAMINOPHEN 7.5-300 MG PO TABS: 1.0000 | ORAL_TABLET | ORAL | Status: DC | PRN

## 2012-12-23 MED ORDER — DEXAMETHASONE SODIUM PHOSPHATE 10 MG/ML IJ SOLN
INTRAMUSCULAR | Status: DC | PRN
  Administered 2012-12-23: 10 mg via INTRAVENOUS

## 2012-12-23 MED ORDER — FENTANYL CITRATE 0.05 MG/ML IJ SOLN
25.0000 ug | INTRAMUSCULAR | Status: DC | PRN
  Administered 2012-12-23 (×2): 50 ug via INTRAVENOUS

## 2012-12-23 MED ORDER — GLYCOPYRROLATE 0.2 MG/ML IJ SOLN
INTRAMUSCULAR | Status: DC | PRN
  Administered 2012-12-23: 0.2 mg via INTRAVENOUS
  Administered 2012-12-23: 0.4 mg via INTRAVENOUS

## 2012-12-23 MED ORDER — NEOSTIGMINE METHYLSULFATE 1 MG/ML IJ SOLN
INTRAMUSCULAR | Status: DC | PRN
  Administered 2012-12-23: 2 mg via INTRAVENOUS

## 2012-12-23 MED ORDER — FENTANYL CITRATE 0.05 MG/ML IJ SOLN
INTRAMUSCULAR | Status: AC
  Filled 2012-12-23: qty 5

## 2012-12-23 MED ORDER — NEOSTIGMINE METHYLSULFATE 1 MG/ML IJ SOLN
INTRAMUSCULAR | Status: AC
  Filled 2012-12-23: qty 1

## 2012-12-23 MED ORDER — METOCLOPRAMIDE HCL 5 MG/ML IJ SOLN: 10.0000 mg | Freq: Once | INTRAMUSCULAR | Status: DC | PRN

## 2012-12-23 MED ORDER — ONDANSETRON HCL 4 MG/2ML IJ SOLN
INTRAMUSCULAR | Status: AC
  Filled 2012-12-23: qty 2

## 2012-12-23 MED ORDER — MIDAZOLAM HCL 2 MG/2ML IJ SOLN
INTRAMUSCULAR | Status: DC | PRN
  Administered 2012-12-23: 2 mg via INTRAVENOUS

## 2012-12-23 MED ORDER — LIDOCAINE HCL (CARDIAC) 20 MG/ML IV SOLN
INTRAVENOUS | Status: DC | PRN
  Administered 2012-12-23: 50 mg via INTRAVENOUS

## 2012-12-23 MED ORDER — GLYCOPYRROLATE 0.2 MG/ML IJ SOLN
INTRAMUSCULAR | Status: AC
  Filled 2012-12-23: qty 3

## 2012-12-23 MED ORDER — FENTANYL CITRATE 0.05 MG/ML IJ SOLN
INTRAMUSCULAR | Status: AC
  Administered 2012-12-23: 50 ug via INTRAVENOUS
  Filled 2012-12-23: qty 2

## 2012-12-23 MED ORDER — KETOROLAC TROMETHAMINE 30 MG/ML IJ SOLN
INTRAMUSCULAR | Status: DC | PRN
  Administered 2012-12-23: 30 mg via INTRAMUSCULAR
  Administered 2012-12-23: 30 mg via INTRAVENOUS

## 2012-12-23 MED ORDER — ONDANSETRON HCL 4 MG/2ML IJ SOLN
INTRAMUSCULAR | Status: DC | PRN
  Administered 2012-12-23: 4 mg via INTRAVENOUS

## 2012-12-23 MED ORDER — LACTATED RINGERS IV SOLN
INTRAVENOUS | Status: DC
  Administered 2012-12-23 (×3): via INTRAVENOUS

## 2012-12-23 MED ORDER — FENTANYL CITRATE 0.05 MG/ML IJ SOLN
INTRAMUSCULAR | Status: DC | PRN
  Administered 2012-12-23: 25 ug via INTRAVENOUS
  Administered 2012-12-23 (×2): 100 ug via INTRAVENOUS
  Administered 2012-12-23: 25 ug via INTRAVENOUS

## 2012-12-23 MED ORDER — PROPOFOL 10 MG/ML IV EMUL
INTRAVENOUS | Status: AC
  Filled 2012-12-23: qty 20

## 2012-12-23 MED ORDER — DEXAMETHASONE SODIUM PHOSPHATE 10 MG/ML IJ SOLN
INTRAMUSCULAR | Status: AC
  Filled 2012-12-23: qty 1

## 2012-12-23 MED ORDER — LACTATED RINGERS IV SOLN
INTRAVENOUS | Status: DC | PRN
  Administered 2012-12-23: 1000 mL

## 2012-12-23 MED ORDER — INDIGOTINDISULFONATE SODIUM 8 MG/ML IJ SOLN
INTRAMUSCULAR | Status: AC
  Filled 2012-12-23: qty 5

## 2012-12-23 MED ORDER — LIDOCAINE HCL (CARDIAC) 20 MG/ML IV SOLN
INTRAVENOUS | Status: AC
  Filled 2012-12-23: qty 5

## 2012-12-23 MED ORDER — ROCURONIUM BROMIDE 100 MG/10ML IV SOLN
INTRAVENOUS | Status: DC | PRN
  Administered 2012-12-23: 5 mg via INTRAVENOUS
  Administered 2012-12-23: 25 mg via INTRAVENOUS

## 2012-12-23 MED ORDER — HYDROCODONE-ACETAMINOPHEN 5-325 MG PO TABS
ORAL_TABLET | ORAL | Status: AC
  Administered 2012-12-23: 1 via ORAL
  Filled 2012-12-23: qty 1

## 2012-12-23 MED ORDER — MIDAZOLAM HCL 2 MG/2ML IJ SOLN
INTRAMUSCULAR | Status: AC
  Filled 2012-12-23: qty 2

## 2012-12-23 MED ORDER — MEPERIDINE HCL 25 MG/ML IJ SOLN: 6.2500 mg | INTRAMUSCULAR | Status: DC | PRN

## 2012-12-23 MED ORDER — HEPARIN SODIUM (PORCINE) 5000 UNIT/ML IJ SOLN
INTRAMUSCULAR | Status: AC
Start: 2012-12-23 — End: 2012-12-23
  Filled 2012-12-23: qty 1

## 2012-12-23 MED ORDER — PROPOFOL 10 MG/ML IV BOLUS
INTRAVENOUS | Status: DC | PRN
  Administered 2012-12-23: 180 mg via INTRAVENOUS

## 2012-12-23 MED ORDER — IBUPROFEN 600 MG PO TABS: 600.0000 mg | ORAL_TABLET | Freq: Four times a day (QID) | ORAL | Status: DC | PRN

## 2012-12-23 SURGICAL SUPPLY — 28 items
CABLE HIGH FREQUENCY MONO STRZ (ELECTRODE) IMPLANT
CHLORAPREP W/TINT 26ML (MISCELLANEOUS) ×2 IMPLANT
CLOTH BEACON ORANGE TIMEOUT ST (SAFETY) ×2 IMPLANT
CONTAINER PREFILL 10% NBF 60ML (FORM) IMPLANT
DERMABOND ADVANCED (GAUZE/BANDAGES/DRESSINGS) ×1
DERMABOND ADVANCED .7 DNX12 (GAUZE/BANDAGES/DRESSINGS) ×1 IMPLANT
DRESSING TELFA 8X3 (GAUZE/BANDAGES/DRESSINGS) IMPLANT
GLOVE SURG SS PI 6.5 STRL IVOR (GLOVE) ×4 IMPLANT
GOWN PREVENTION PLUS LG XLONG (DISPOSABLE) ×4 IMPLANT
NS IRRIG 1000ML POUR BTL (IV SOLUTION) ×2 IMPLANT
PACK LAPAROSCOPY BASIN (CUSTOM PROCEDURE TRAY) ×2 IMPLANT
POUCH SPECIMEN RETRIEVAL 10MM (ENDOMECHANICALS) IMPLANT
PROTECTOR NERVE ULNAR (MISCELLANEOUS) ×2 IMPLANT
SCALPEL HARMONIC ACE (MISCELLANEOUS) IMPLANT
SET IRRIG TUBING LAPAROSCOPIC (IRRIGATION / IRRIGATOR) IMPLANT
STRIP CLOSURE SKIN 1/4X3 (GAUZE/BANDAGES/DRESSINGS) IMPLANT
SUT MNCRL AB 4-0 PS2 18 (SUTURE) IMPLANT
SUT VIC AB 3-0 PS2 18 (SUTURE)
SUT VIC AB 3-0 PS2 18XBRD (SUTURE) IMPLANT
SUT VICRYL 0 ENDOLOOP (SUTURE) IMPLANT
SUT VICRYL 0 UR6 27IN ABS (SUTURE) IMPLANT
SYR 50ML LL SCALE MARK (SYRINGE) ×2 IMPLANT
TOWEL OR 17X24 6PK STRL BLUE (TOWEL DISPOSABLE) ×4 IMPLANT
TRAY FOLEY CATH 14FR (SET/KITS/TRAYS/PACK) ×2 IMPLANT
TROCAR BALL TOP DISP 5MM (ENDOMECHANICALS) IMPLANT
TROCAR XCEL DIL TIP R 11M (ENDOMECHANICALS) IMPLANT
WARMER LAPAROSCOPE (MISCELLANEOUS) ×2 IMPLANT
WATER STERILE IRR 1000ML POUR (IV SOLUTION) ×2 IMPLANT

## 2012-12-23 NOTE — Anesthesia Postprocedure Evaluation (Signed)
  Anesthesia Post-op Note  Patient: Vickie Elliott  Procedure(s) Performed: Procedure(s): LAPAROSCOPY OPERATIVE BIOPSY OF LEFT URETEROSCRAL LIGAMENT AND FULGERATION OF ENDOMETRIOSIS (N/A)  Patient Location: PACU  Anesthesia Type:General  Level of Consciousness: awake, alert  and oriented  Airway and Oxygen Therapy: Patient Spontanous Breathing  Post-op Pain: none  Post-op Assessment: Post-op Vital signs reviewed, Patient's Cardiovascular Status Stable, Respiratory Function Stable, Patent Airway, No signs of Nausea or vomiting and Pain level controlled  Post-op Vital Signs: Reviewed and stable  Complications: No apparent anesthesia complications

## 2012-12-23 NOTE — Anesthesia Preprocedure Evaluation (Signed)
Anesthesia Evaluation  Patient identified by MRN, date of birth, ID band Patient awake    Reviewed: Allergy & Precautions, H&P , NPO status , Patient's Chart, lab work & pertinent test results  Airway Mallampati: III TM Distance: >3 FB Neck ROM: Full    Dental no notable dental hx. (+) Teeth Intact   Pulmonary neg pulmonary ROS,  breath sounds clear to auscultation  Pulmonary exam normal       Cardiovascular negative cardio ROS  Rhythm:Regular Rate:Normal     Neuro/Psych  Headaches, negative psych ROS   GI/Hepatic negative GI ROS, Neg liver ROS,   Endo/Other  negative endocrine ROS  Renal/GU negative Renal ROS  negative genitourinary   Musculoskeletal negative musculoskeletal ROS (+)   Abdominal (+) + obese,   Peds  Hematology negative hematology ROS (+)   Anesthesia Other Findings   Reproductive/Obstetrics Dysmenorrhea                           Anesthesia Physical Anesthesia Plan  ASA: II  Anesthesia Plan: General   Post-op Pain Management:    Induction: Intravenous  Airway Management Planned: Oral ETT  Additional Equipment:   Intra-op Plan:   Post-operative Plan: Extubation in OR  Informed Consent: I have reviewed the patients History and Physical, chart, labs and discussed the procedure including the risks, benefits and alternatives for the proposed anesthesia with the patient or authorized representative who has indicated his/her understanding and acceptance.   Dental advisory given  Plan Discussed with: Anesthesiologist, CRNA and Surgeon  Anesthesia Plan Comments:         Anesthesia Quick Evaluation

## 2012-12-23 NOTE — Transfer of Care (Signed)
Immediate Anesthesia Transfer of Care Note  Patient: Vickie Elliott  Procedure(s) Performed: Procedure(s): LAPAROSCOPY OPERATIVE BIOPSY OF LEFT URETEROSCRAL LIGAMENT AND FULGERATION OF ENDOMETRIOSIS (N/A)  Patient Location: PACU  Anesthesia Type:General  Level of Consciousness: awake  Airway & Oxygen Therapy: Patient Spontanous Breathing  Post-op Assessment: Report given to PACU RN  Post vital signs: stable  Filed Vitals:   12/23/12 1101  BP: 120/75  Pulse: 100  Temp: 36.8 C  Resp: 18    Complications: No apparent anesthesia complications

## 2012-12-23 NOTE — Preoperative (Signed)
Beta Blockers   Reason not to administer Beta Blockers:Not Applicable 

## 2012-12-24 ENCOUNTER — Encounter (HOSPITAL_COMMUNITY): Payer: Self-pay | Admitting: Obstetrics and Gynecology

## 2012-12-24 NOTE — Op Note (Signed)
Diagnostic Laparoscopy Procedure Note  Indications: The patient is a 27 y.o. female with pelvic pain, dysmenorrhea and dyspareunia  Pre-operative Diagnosis: Pelvic pain.     Dysmenorrhea.     Dyspareunia  Post-operative Diagnosis: Pelvic pain.     Dysmenorrhea.     Dyspareunia.     Endometriosis  Surgeon: Dierdre Forth P   Assistants: None  Anesthesia: General endotracheal anesthesia  ASA Class: 2  Procedure Details  The patient was seen in the Holding Room. The risks, benefits, complications, treatment options, and expected outcomes were discussed with the patient. The possibilities of reaction to medication, pulmonary aspiration, perforation of viscus, bleeding, recurrent infection, the need for additional procedures, failure to diagnose a condition, and creating a complication requiring transfusion or operation were discussed with the patient. The patient concurred with the proposed plan, giving informed consent. The patient was taken to the Operating Room, identified as Vickie Elliott and the procedure verified as Diagnostic Laparoscopy. A Time Out was held and the above information confirmed.  After induction of general anesthesia, the patient was placed in modified dorsal lithotomy position where the abdomen was prepped with chlor prep, and draped. Previously, the vagina and perineum had been prepped with Betadine, a sterile Foley catheter placed, and connected to straight drainage.  The cervix was visualized and an intrauterine manipulator was placed. Gloves were changed and the surgeon was gowned. The subumbilical and suprapubic regions were infiltrated with a total of 10 cc of 0.25% Marcaine.  A 1 cm umbilical incision was then performed. Veress needle was passed and pneumoperitoneum was established. A 10 mm trocar was then placed into the subumbilical incision and the laparoscope placed through the trocar sleeve. A suprapubic incision was made to the left of midline and a  laparoscopic probe trocar placed through that incision under direct visualization. The above findings were noted.  A biopsy was taken from the left uterosacral ligament, cervical junction, at the site of proposed endometriosis. This area was then cauterized with bipolar cautery to achieve excellent hemostasis. A small area of proposed endometriosis on the left ovary that had been adjacent to the left uterosacral ligament was then cauterized to completely ablate. The visualized abnormality. Copious irrigation was carried out approximately 60 cc of warm lactated Ringer's left in the pelvis. 20 cc of 0.25% Marcaine was likewise left in the pelvis.  Following the procedure the umbilical sheath was removed after intra-abdominal carbon dioxide was expressed. The incision was closed with subcutaneous and subcuticular sutures of 4-0 Vicryl. The intrauterine manipulator was then removed.  Instrument, sponge, and needle counts were correct prior to abdominal closure and at the conclusion of the case.   Findings: The anterior cul-de-sac and round ligaments no evidence of adhesions or endometriosis. The uterus , normal size and mobile. The posterior uterus near its junction with the uterosacral ligament contained a denuded erythematous area that extended onto the left uterosacral ligament and was initially densely adherent to the left ovary. This was all consistent with endometriosis. The adnexa . The right tube and ovary were with in normal limits without evidence of adhesions or endometriosis. The left tube was normal with fine fimbria at the end, and no adhesions. The left ovary was adherent over a 2 cm portion to the posterior uterus and left uterosacral ligament, but was easily read of these adhesions. Cul-de-sac . The cul-de-sac was free of lesions or adhesions.  Estimated Blood Loss:  less than 50 mL         Drains: None  Specimens: Biopsy of left uterosacral ligament, consistent with  endometriosis sent to pathology              Complications:  None; patient tolerated the procedure well.         Disposition: PACU - hemodynamically stable.         Condition: Stable.  The patient was to be discharged home after postanesthesia care.

## 2014-04-06 ENCOUNTER — Encounter (HOSPITAL_COMMUNITY): Payer: Self-pay

## 2014-04-06 ENCOUNTER — Emergency Department (HOSPITAL_COMMUNITY)
Admission: EM | Admit: 2014-04-06 | Discharge: 2014-04-06 | Disposition: A | Discharge status: Home / Self Care | Payer: No Typology Code available for payment source | Attending: Emergency Medicine | Admitting: Emergency Medicine

## 2014-04-06 DIAGNOSIS — J020 Streptococcal pharyngitis: Secondary | ICD-10-CM | POA: Diagnosis not present

## 2014-04-06 DIAGNOSIS — Z8619 Personal history of other infectious and parasitic diseases: Secondary | ICD-10-CM | POA: Insufficient documentation

## 2014-04-06 DIAGNOSIS — Z793 Long term (current) use of hormonal contraceptives: Secondary | ICD-10-CM | POA: Insufficient documentation

## 2014-04-06 DIAGNOSIS — Z87440 Personal history of urinary (tract) infections: Secondary | ICD-10-CM | POA: Diagnosis not present

## 2014-04-06 DIAGNOSIS — Z8679 Personal history of other diseases of the circulatory system: Secondary | ICD-10-CM | POA: Insufficient documentation

## 2014-04-06 DIAGNOSIS — Z8742 Personal history of other diseases of the female genital tract: Secondary | ICD-10-CM | POA: Diagnosis not present

## 2014-04-06 DIAGNOSIS — J029 Acute pharyngitis, unspecified: Secondary | ICD-10-CM | POA: Diagnosis present

## 2014-04-06 LAB — RAPID STREP SCREEN (MED CTR MEBANE ONLY): Streptococcus, Group A Screen (Direct): POSITIVE — AB

## 2014-04-06 MED ORDER — DEXAMETHASONE SODIUM PHOSPHATE 10 MG/ML IJ SOLN
5.0000 mg | Freq: Once | INTRAMUSCULAR | Status: AC
Start: 2014-04-06 — End: 2014-04-06
  Administered 2014-04-06: 5 mg via INTRAMUSCULAR
  Filled 2014-04-06: qty 1

## 2014-04-06 MED ORDER — SODIUM CHLORIDE 0.9 % IV BOLUS (SEPSIS)
1000.0000 mL | Freq: Once | INTRAVENOUS | Status: AC
  Administered 2014-04-06: 1000 mL via INTRAVENOUS

## 2014-04-06 MED ORDER — IBUPROFEN 200 MG PO TABS
600.0000 mg | ORAL_TABLET | Freq: Once | ORAL | Status: AC
  Administered 2014-04-06: 600 mg via ORAL
  Filled 2014-04-06: qty 3

## 2014-04-06 MED ORDER — HYDROCOD POLST-CHLORPHEN POLST 10-8 MG/5ML PO LQCR: 5.0000 mL | Freq: Two times a day (BID) | ORAL | Status: DC | PRN

## 2014-04-06 MED ORDER — PENICILLIN G BENZATHINE 1200000 UNIT/2ML IM SUSP
1.2000 10*6.[IU] | Freq: Once | INTRAMUSCULAR | Status: AC
  Administered 2014-04-06: 1.2 10*6.[IU] via INTRAMUSCULAR
  Filled 2014-04-06: qty 2

## 2014-04-06 MED ORDER — PENICILLIN G BENZATHINE & PROC 1200000 UNIT/2ML IM SUSP: 1.2000 10*6.[IU] | Freq: Once | INTRAMUSCULAR | Status: DC

## 2014-04-06 NOTE — Discharge Instructions (Signed)
Return to the emergency room with worsening of symptoms, new symptoms or with symptoms that are concerning , especially fevers, stiff neck, worsening headache, nausea/vomiting, visual changes or slurred speech, chest pain, shortness of breath, cough with thick colored mucous or blood Drink plenty of fluids with electrolytes especially Gatorade. OTC cold medications such as mucinex, nyquil, dayquil are recommended. Chloraseptic for sore throat. If you require cough syrup. Do not operate machinery, drive or drink alcohol while taking narcotics or muscle relaxers, cough syrup. Follow up with Primary care provider in 2 days for reevaluation Read below information and follow recommendations.   Pharyngitis Pharyngitis is redness, pain, and swelling (inflammation) of your pharynx.  CAUSES  Pharyngitis is usually caused by infection. Most of the time, these infections are from viruses (viral) and are part of a cold. However, sometimes pharyngitis is caused by bacteria (bacterial). Pharyngitis can also be caused by allergies. Viral pharyngitis may be spread from person to person by coughing, sneezing, and personal items or utensils (cups, forks, spoons, toothbrushes). Bacterial pharyngitis may be spread from person to person by more intimate contact, such as kissing.  SIGNS AND SYMPTOMS  Symptoms of pharyngitis include:   Sore throat.   Tiredness (fatigue).   Low-grade fever.   Headache.  Joint pain and muscle aches.  Skin rashes.  Swollen lymph nodes.  Plaque-like film on throat or tonsils (often seen with bacterial pharyngitis). DIAGNOSIS  Your health care provider will ask you questions about your illness and your symptoms. Your medical history, along with a physical exam, is often all that is needed to diagnose pharyngitis. Sometimes, a rapid strep test is done. Other lab tests may also be done, depending on the suspected cause.  TREATMENT  Viral pharyngitis will usually get better in  3-4 days without the use of medicine. Bacterial pharyngitis is treated with medicines that kill germs (antibiotics).  HOME CARE INSTRUCTIONS   Drink enough water and fluids to keep your urine clear or pale yellow.   Only take over-the-counter or prescription medicines as directed by your health care provider:   If you are prescribed antibiotics, make sure you finish them even if you start to feel better.   Do not take aspirin.   Get lots of rest.   Gargle with 8 oz of salt water ( tsp of salt per 1 qt of water) as often as every 1-2 hours to soothe your throat.   Throat lozenges (if you are not at risk for choking) or sprays may be used to soothe your throat. SEEK MEDICAL CARE IF:   You have large, tender lumps in your neck.  You have a rash.  You cough up green, yellow-brown, or bloody spit. SEEK IMMEDIATE MEDICAL CARE IF:   Your neck becomes stiff.  You drool or are unable to swallow liquids.  You vomit or are unable to keep medicines or liquids down.  You have severe pain that does not go away with the use of recommended medicines.  You have trouble breathing (not caused by a stuffy nose). MAKE SURE YOU:   Understand these instructions.  Will watch your condition.  Will get help right away if you are not doing well or get worse. Document Released: 02/05/2005 Document Revised: 11/26/2012 Document Reviewed: 10/13/2012 [facility] Patient Information 2015 [place]. This information is not intended to replace advice given to you by your health care provider. Make sure you discuss any questions you have with your health care provider.

## 2014-04-06 NOTE — ED Provider Notes (Signed)
CSN: [id number]     Arrival date & time 04/06/14  4098 History   First MD Initiated Contact with Patient 04/06/14 1007     Chief Complaint  Patient presents with  . Sore Throat     (Consider location/radiation/quality/duration/timing/severity/associated sxs/prior Treatment) HPI  Vickie Elliott is a 29 y.o. female presenting sore throat since yesterday as well as tactile fevers and nasal congestion. Patient states one week ago she had a cold with a cough but that has resolved. Today she is concerned with poor sore throat and is scratchy and swollen. Pain aggravated by eating drinking. She has taken over-the-counter remedies with mild relief. Patient states she's had this before. Denies any tonsillectomies. She denies any history of diabetes. No trismus or drooling.   Past Medical History  Diagnosis Date  . Urinary tract infection   . BV (bacterial vaginosis)   . Vaginal yeast infection   . Headache(784.0)     migraines   Past Surgical History  Procedure Laterality Date  . No past surgeries    . Laparoscopy N/A 12/23/2012    Procedure: LAPAROSCOPY OPERATIVE BIOPSY OF LEFT URETEROSCRAL LIGAMENT AND FULGERATION OF ENDOMETRIOSIS;  Surgeon: Hal Morales, MD;  Location: [facility];  Service: Gynecology;  Laterality: N/A;   Family History  Problem Relation Age of Onset  . Diabetes Mother   . Hypertension Mother   . Asthma Sister    History  Substance Use Topics  . Smoking status: Never Smoker   . Smokeless tobacco: Not on file  . Alcohol Use: Yes     Comment: occ   OB History    No data available     Review of Systems  Constitutional: Positive for fever and chills.  HENT: Positive for congestion and sore throat. Negative for rhinorrhea.   Respiratory: Negative for cough and shortness of breath.   Cardiovascular: Negative for chest pain and palpitations.  Gastrointestinal: Negative for nausea and vomiting.  Neurological: Negative for weakness and numbness.      Allergies   Review of patient's allergies indicates no known allergies.  Home Medications   Prior to Admission medications   Medication Sig Start Date End Date Taking? Authorizing Provider  chlorpheniramine-HYDROcodone (TUSSIONEX PENNKINETIC ER) 10-8 MG/5ML LQCR Take 5 mLs by mouth every 12 (twelve) hours as needed for cough. 04/06/14   Louann Sjogren, PA-C  Hydrocodone-Acetaminophen 7.5-300 MG TABS Take 1 tablet by mouth every 4 (four) hours as needed. 12/23/12   Hal Morales, MD  ibuprofen (ADVIL,MOTRIN) 600 MG tablet Take 1 tablet (600 mg total) by mouth every 6 (six) hours as needed. 12/23/12   Hal Morales, MD  norgestrel-ethinyl estradiol (LO/OVRAL,CRYSELLE) 0.3-30 MG-MCG tablet Take 1 tablet by mouth daily.    Historical Provider, MD   BP 127/75 mmHg  Pulse 91  Temp(Src) 99.2 F (37.3 C) (Oral)  Resp 18  SpO2 100%  LMP 03/23/2014 Physical Exam  Constitutional: She appears well-developed and well-nourished. No distress.  HENT:  Head: Normocephalic and atraumatic.  Nose: Right sinus exhibits no maxillary sinus tenderness and no frontal sinus tenderness. Left sinus exhibits no maxillary sinus tenderness and no frontal sinus tenderness.  Mouth/Throat: Mucous membranes are normal. Posterior oropharyngeal edema and posterior oropharyngeal erythema present. No oropharyngeal exudate.  No uvula deviation no trismus.  Eyes: Conjunctivae and EOM are normal. Right eye exhibits no discharge. Left eye exhibits no discharge.  Neck: Normal range of motion. Neck supple.  Cardiovascular: Normal rate, regular rhythm and normal heart sounds.  Pulmonary/Chest: Effort normal and breath sounds normal. No respiratory distress. She has no wheezes. She has no rales.  Abdominal: Soft. Bowel sounds are normal. She exhibits no distension. There is no tenderness.  Lymphadenopathy:    She has cervical adenopathy.  Neurological: She is alert.  Skin: Skin is warm and dry. She is not diaphoretic.  Nursing  note and vitals reviewed.   ED Course  Procedures (including critical care time) Labs Review Labs Reviewed  RAPID STREP SCREEN - Abnormal; Notable for the following:    Streptococcus, Group A Screen (Direct) POSITIVE (*)    All other components within normal limits    Imaging Review No results found.   EKG Interpretation None      MDM   Final diagnoses:  Streptococcal pharyngitis   Patient presenting with sore throat, fever, 1 day. Patient diagnosed with strep pharyngitis by rapid strep. Patient given Bicillin, Decadron, ibuprofen in ED. Patient with initial tachycardia and was given a bolus with improvement of her heart rate into the 90s. Patient nontoxic appearing and tolerating oral fluids. Discussed importance of water rehydration. Presentation non concerning for PTA or infxn spread to soft tissue. No trismus or uvula deviation. Follow-up with PCP in 2 days  Discussed return precautions with patient. Discussed all results and patient verbalizes understanding and agrees with plan.    Louann SjogrenVictoria L Kimbella Heisler, PA-C 04/06/14 1237  Elwin MochaBlair Walden, MD 04/06/14 [phone]

## 2014-04-06 NOTE — ED Notes (Signed)
Pt states sore throat since yesterday.  Fever.  Nasal congestion.

## 2015-03-07 ENCOUNTER — Other Ambulatory Visit: Payer: Self-pay | Admitting: Obstetrics and Gynecology

## 2015-03-07 DIAGNOSIS — N803 Endometriosis of pelvic peritoneum, unspecified: Secondary | ICD-10-CM

## 2015-03-10 ENCOUNTER — Ambulatory Visit
Admission: RE | Admit: 2015-03-10 | Discharge: 2015-03-10 | Disposition: A | Discharge status: Home / Self Care | Payer: BC Managed Care – PPO | Source: Ambulatory Visit | Attending: Obstetrics and Gynecology | Admitting: Obstetrics and Gynecology

## 2015-03-10 DIAGNOSIS — N803 Endometriosis of pelvic peritoneum, unspecified: Secondary | ICD-10-CM

## 2015-03-10 MED ORDER — IOPAMIDOL (ISOVUE-300) INJECTION 61%
100.0000 mL | Freq: Once | INTRAVENOUS | Status: DC | PRN
Start: 2015-03-10 — End: 2015-03-11

## 2015-07-27 ENCOUNTER — Other Ambulatory Visit: Payer: Self-pay | Admitting: Obstetrics and Gynecology

## 2015-07-28 LAB — CYTOLOGY - PAP

## 2015-09-07 ENCOUNTER — Other Ambulatory Visit: Payer: Self-pay | Admitting: Obstetrics and Gynecology

## 2015-11-21 ENCOUNTER — Other Ambulatory Visit: Payer: Self-pay | Admitting: Obstetrics and Gynecology

## 2015-12-12 NOTE — Patient Instructions (Signed)
Your procedure is scheduled on:  Monday, Nov. 6, 2017  [facility]  11:00 AM  Pick up the phone at the desk and dial [phone].  Call this number if you have problems the morning of surgery: [phone].  Remember: Do NOT eat food or drink after:  Midnight Sunday, Nov. 5, 2017  Take these medicines the morning of surgery with a SIP OF WATER:  None  Stop ALL herbal medications at this time   Do NOT wear jewelry (body piercing), metal hair clips/bobby pins, make-up, or nail polish. Do NOT wear lotions, powders, or perfumes.  You may wear deodorant. Do NOT shave for 48 hours prior to surgery. Do NOT bring valuables to the hospital. Contacts, dentures, or bridgework may not be worn into surgery.  Leave suitcase in car.  After surgery it may be brought to your room.  For patients admitted to the hospital, checkout time is 11:00 AM the day of discharge.

## 2015-12-12 NOTE — H&P (Signed)
Vickie Elliott is a 30 y.o.  female,  P: 1-0-0-1 who  presents for hysterectomy because of symptomatic endometriosis, menorrhagia, chronic pelvic pain  and dysmenorrhea .  Since the birth of her child in 2008,  the patient reports worsening pelvic pain,  with and without menses,  and a heavy menstrual flow. In 2014 she underwent a diagnostic laparoscopy for throbbing  left pelvic pain and was diagnosed with endometriosis.  She has since  used  oral contraceptives, NSAIDs and the Skyla IUD for management however, she found no relief from her pain or menstrual flow. .  Her menstrual flow lasts  5-14 days with a break of  1 week then it will resume.  Her pad change, due to clots, occurs every 1-1.5 hours accompanied by  severe, cramping,  rated 10/10 on a 10 point pain scale.  The patient requires narcotic analgesia  for pain management.  A pelvic ultrasound in 2014 revealed a retroverted uterus: 5.70 x 5.08 x 3.76 cm, endometrium: 2.89 mm; right ovary-2.54 cm and left ovary-2.86 cm.  A CT of the abdomen and pelvis (with contrast) in 2017  did not show any structural abnormalities.  The patient reports dyspareunia,  constant dull lower back pain (made worse with standing) ,  pelvic pressure with bowel movements and  also when her bladder is full .   A review of both medical and surgical management options were given to the patient for her disease and symptoms.  However, due to the chronicity and disability  associated with her symptoms , the patient has decided to proceed with definitive therapy in the form of hysterectomy.  Past Medical History  OB History:   G: 1;    P: 1-0-0-1;  SVB 2008  GYN History: menarche: 30 YO    LMP: 10/29/2015    Contracepton:  oral contraceptives;  Admits to a history of abnormal PAP smear=treated with LEEP;  History of  positive HPV;  Last PAP smear: 2016-normal  Medical History:  Polycystic Ovarian Syndrome and Endometriosis  Surgical History: 2014:   Diagnostic Laparoscopy for  Pelvic Pain-Endometriosis   and 2014  Loop Electrogsurgical Excision Procedure Denies problems with anesthesia or history of blood transfusions  Family History: Diabetes Mellitus, Asthma, Stroke, Hypertension, Heart Disease and Colon Cancer  Social History: Married and employed as a Runner, broadcasting/film/video      Denies tobacco use and occasionally uses alcohol  Medications: Naproxen 550 mg  1 po pc bid prn Pirmella 1/35 1 po daily Vitamin D3  daily  Allergies  Allergen Reactions  . Ciprofloxacin Other (See Comments)    Reaction:  Severe joint pain     Denies sensitivity to peanuts, shellfish, soy, latex or adhesives.   ROS: Admits to glasses, pelvic pain, urinary urgency and acne but   denies headache, vision changes, nasal congestion, dysphagia, tinnitus, dizziness, hoarseness, cough,  chest pain, shortness of breath, nausea, vomiting, diarrhea,constipation,  urinary frequency,  dysuria, hematuria, vaginitis symptoms,  swelling of joints,easy bruising,  myalgias, arthralgias,  unexplained weight loss and except as is mentioned in the history of present illness, patient's review of systems is otherwise negative.     Physical Exam Bp: 100/62       P: 80 bpm   R: 20  Temperature: 98.7 degrees F orally   Weight: 173 lbs.  Height: 5\' 4"   BMI: 29  Neck: supple without masses or thyromegaly Lungs: clear to auscultation Heart: regular rate and rhythm Abdomen: soft, diffusely tender in both lower quadrants  with guarding,   but no rebound and no organomegaly Pelvic:EGBUS- wnl; vagina-normal rugae but tender;  uterus-normal size, tender;  cervix without lesions or motion tenderness; adnexae-bilateral tenderness but no palpable  masses  (exam limited by patient discomfort. Extremities:  no clubbing, cyanosis or edema   Assesment:  Endometriosis            Chronic Pelvic Pain            Menorrhagia            Dysmenorrhea   Disposition:  A discussion was held with patient regarding the indication for  her procedure(s) along with the risks, which include but are not limited to: reaction to anesthesia, damage to adjacent organs, infection and excessive bleeding.  The patient verbalized understanding of these risks and has consented to proceed with a Laparoscopically Assisted Total Vaginal Hysterectomy, Bilateral Salpingectomy, Possible Bilateral Oophorectomy and [facility].   CSN# [id number]   Elmira J. Lowell GuitarPowell, PA-C  for Dr.  Maris BergerVanessa P. Haygood

## 2015-12-13 ENCOUNTER — Encounter (HOSPITAL_COMMUNITY): Payer: Self-pay

## 2015-12-13 ENCOUNTER — Encounter (HOSPITAL_COMMUNITY)
Admission: RE | Admit: 2015-12-13 | Discharge: 2015-12-13 | Disposition: A | Discharge status: Home / Self Care | Payer: BC Managed Care – PPO | Source: Ambulatory Visit | Attending: Obstetrics and Gynecology | Admitting: Obstetrics and Gynecology

## 2015-12-13 DIAGNOSIS — R102 Pelvic and perineal pain: Secondary | ICD-10-CM | POA: Insufficient documentation

## 2015-12-13 LAB — CBC
HEMATOCRIT: 35.6 % — AB (ref 36.0–46.0)
Hemoglobin: 12.2 g/dL (ref 12.0–15.0)
MCH: 30.8 pg (ref 26.0–34.0)
MCHC: 34.3 g/dL (ref 30.0–36.0)
MCV: 89.9 fL (ref 78.0–100.0)
PLATELETS: 341 10*3/uL (ref 150–400)
RBC: 3.96 MIL/uL (ref 3.87–5.11)
RDW: 13.5 % (ref 11.5–15.5)
WBC: 10.5 10*3/uL (ref 4.0–10.5)

## 2015-12-25 MED ORDER — CEFOTETAN DISODIUM 2 G IJ SOLR
2.0000 g | INTRAMUSCULAR | Status: AC
  Administered 2015-12-26: 2 g via INTRAVENOUS
  Filled 2015-12-25: qty 2

## 2015-12-26 ENCOUNTER — Observation Stay (HOSPITAL_COMMUNITY)
Admission: RE | Admit: 2015-12-26 | Discharge: 2015-12-27 | Disposition: A | Discharge status: Home / Self Care | Payer: BC Managed Care – PPO | Source: Ambulatory Visit | Attending: Obstetrics and Gynecology | Admitting: Obstetrics and Gynecology

## 2015-12-26 ENCOUNTER — Ambulatory Visit (HOSPITAL_COMMUNITY): Payer: BC Managed Care – PPO | Admitting: Anesthesiology

## 2015-12-26 ENCOUNTER — Encounter (HOSPITAL_COMMUNITY): Payer: Self-pay | Admitting: Anesthesiology

## 2015-12-26 ENCOUNTER — Encounter (HOSPITAL_COMMUNITY)
Admission: RE | Disposition: A | Discharge status: Home / Self Care | Payer: Self-pay | Source: Ambulatory Visit | Attending: Obstetrics and Gynecology

## 2015-12-26 DIAGNOSIS — D649 Anemia, unspecified: Secondary | ICD-10-CM | POA: Diagnosis not present

## 2015-12-26 DIAGNOSIS — G8929 Other chronic pain: Secondary | ICD-10-CM | POA: Diagnosis present

## 2015-12-26 DIAGNOSIS — E282 Polycystic ovarian syndrome: Secondary | ICD-10-CM | POA: Diagnosis not present

## 2015-12-26 DIAGNOSIS — Z881 Allergy status to other antibiotic agents status: Secondary | ICD-10-CM | POA: Insufficient documentation

## 2015-12-26 DIAGNOSIS — R102 Pelvic and perineal pain: Secondary | ICD-10-CM | POA: Insufficient documentation

## 2015-12-26 DIAGNOSIS — N920 Excessive and frequent menstruation with regular cycle: Secondary | ICD-10-CM | POA: Diagnosis present

## 2015-12-26 DIAGNOSIS — IMO0002 Reserved for concepts with insufficient information to code with codable children: Secondary | ICD-10-CM | POA: Diagnosis present

## 2015-12-26 DIAGNOSIS — N803 Endometriosis of pelvic peritoneum: Secondary | ICD-10-CM | POA: Diagnosis not present

## 2015-12-26 DIAGNOSIS — N946 Dysmenorrhea, unspecified: Secondary | ICD-10-CM | POA: Insufficient documentation

## 2015-12-26 DIAGNOSIS — N72 Inflammatory disease of cervix uteri: Secondary | ICD-10-CM | POA: Diagnosis not present

## 2015-12-26 HISTORY — PX: LAPAROSCOPIC VAGINAL HYSTERECTOMY WITH SALPINGO OOPHORECTOMY: SHX6681

## 2015-12-26 LAB — PREGNANCY, URINE: Preg Test, Ur: NEGATIVE

## 2015-12-26 SURGERY — HYSTERECTOMY, VAGINAL, LAPAROSCOPY-ASSISTED, WITH SALPINGO-OOPHORECTOMY
Anesthesia: General | Site: Abdomen | Laterality: Bilateral

## 2015-12-26 MED ORDER — LACTATED RINGERS IV SOLN
INTRAVENOUS | Status: DC
  Administered 2015-12-27: 02:00:00 via INTRAVENOUS

## 2015-12-26 MED ORDER — ONDANSETRON HCL 4 MG/2ML IJ SOLN
INTRAMUSCULAR | Status: DC | PRN
  Administered 2015-12-26: 4 mg via INTRAVENOUS

## 2015-12-26 MED ORDER — PROPOFOL 10 MG/ML IV BOLUS
INTRAVENOUS | Status: AC
  Filled 2015-12-26: qty 20

## 2015-12-26 MED ORDER — OXYCODONE-ACETAMINOPHEN 5-325 MG PO TABS
1.0000 | ORAL_TABLET | ORAL | Status: DC | PRN
  Administered 2015-12-27: 1 via ORAL
  Filled 2015-12-26: qty 1

## 2015-12-26 MED ORDER — SCOPOLAMINE 1 MG/3DAYS TD PT72
MEDICATED_PATCH | TRANSDERMAL | Status: AC
  Filled 2015-12-26: qty 1

## 2015-12-26 MED ORDER — HYDROMORPHONE 1 MG/ML IV SOLN
INTRAVENOUS | Status: DC
  Administered 2015-12-26: 1.8 mg via INTRAVENOUS
  Administered 2015-12-26: 16:00:00 via INTRAVENOUS
  Administered 2015-12-26: 1.2 mg via INTRAVENOUS
  Administered 2015-12-27: 1.8 mg via INTRAVENOUS
  Administered 2015-12-27: 0.6 mg via INTRAVENOUS
  Administered 2015-12-27: 0.9 mg via INTRAVENOUS
  Filled 2015-12-26: qty 25

## 2015-12-26 MED ORDER — KETOROLAC TROMETHAMINE 30 MG/ML IJ SOLN
INTRAMUSCULAR | Status: DC | PRN
  Administered 2015-12-26: 30 mg via INTRAVENOUS

## 2015-12-26 MED ORDER — FENTANYL CITRATE (PF) 100 MCG/2ML IJ SOLN
INTRAMUSCULAR | Status: DC | PRN
  Administered 2015-12-26: 50 ug via INTRAVENOUS
  Administered 2015-12-26: 150 ug via INTRAVENOUS
  Administered 2015-12-26 (×3): 50 ug via INTRAVENOUS

## 2015-12-26 MED ORDER — SODIUM CHLORIDE 0.9% FLUSH: 9.0000 mL | INTRAVENOUS | Status: DC | PRN

## 2015-12-26 MED ORDER — METOCLOPRAMIDE HCL 5 MG/ML IJ SOLN: 10.0000 mg | Freq: Once | INTRAMUSCULAR | Status: DC | PRN

## 2015-12-26 MED ORDER — HYDROMORPHONE HCL 1 MG/ML IJ SOLN
INTRAMUSCULAR | Status: AC
  Filled 2015-12-26: qty 1

## 2015-12-26 MED ORDER — DEXAMETHASONE SODIUM PHOSPHATE 4 MG/ML IJ SOLN
INTRAMUSCULAR | Status: DC | PRN
  Administered 2015-12-26: 10 mg via INTRAVENOUS

## 2015-12-26 MED ORDER — ROCURONIUM BROMIDE 100 MG/10ML IV SOLN
INTRAVENOUS | Status: DC | PRN
  Administered 2015-12-26: 10 mg via INTRAVENOUS
  Administered 2015-12-26: 50 mg via INTRAVENOUS
  Administered 2015-12-26: 5 mg via INTRAVENOUS

## 2015-12-26 MED ORDER — MIDAZOLAM HCL 2 MG/2ML IJ SOLN
INTRAMUSCULAR | Status: DC | PRN
  Administered 2015-12-26: 2 mg via INTRAVENOUS

## 2015-12-26 MED ORDER — KETOROLAC TROMETHAMINE 30 MG/ML IJ SOLN
INTRAMUSCULAR | Status: AC
  Filled 2015-12-26: qty 1

## 2015-12-26 MED ORDER — BUPIVACAINE HCL (PF) 0.25 % IJ SOLN
INTRAMUSCULAR | Status: AC
  Filled 2015-12-26: qty 30

## 2015-12-26 MED ORDER — ONDANSETRON HCL 4 MG PO TABS: 4.0000 mg | ORAL_TABLET | Freq: Three times a day (TID) | ORAL | Status: DC | PRN

## 2015-12-26 MED ORDER — NEOSTIGMINE METHYLSULFATE 10 MG/10ML IV SOLN
INTRAVENOUS | Status: AC
  Filled 2015-12-26: qty 1

## 2015-12-26 MED ORDER — DIPHENHYDRAMINE HCL 50 MG/ML IJ SOLN: 12.5000 mg | Freq: Four times a day (QID) | INTRAMUSCULAR | Status: DC | PRN

## 2015-12-26 MED ORDER — GLYCOPYRROLATE 0.2 MG/ML IJ SOLN
INTRAMUSCULAR | Status: DC | PRN
  Administered 2015-12-26: 0.2 mg via INTRAVENOUS

## 2015-12-26 MED ORDER — LACTATED RINGERS IV SOLN
INTRAVENOUS | Status: DC
  Administered 2015-12-26 (×3): via INTRAVENOUS

## 2015-12-26 MED ORDER — LIDOCAINE HCL (CARDIAC) 20 MG/ML IV SOLN
INTRAVENOUS | Status: AC
  Filled 2015-12-26: qty 5

## 2015-12-26 MED ORDER — SUGAMMADEX SODIUM 200 MG/2ML IV SOLN
INTRAVENOUS | Status: DC | PRN
  Administered 2015-12-26: 158 mg via INTRAVENOUS

## 2015-12-26 MED ORDER — BUPIVACAINE HCL (PF) 0.25 % IJ SOLN
INTRAMUSCULAR | Status: DC | PRN
  Administered 2015-12-26: 30 mL

## 2015-12-26 MED ORDER — MIDAZOLAM HCL 2 MG/2ML IJ SOLN
INTRAMUSCULAR | Status: AC
  Filled 2015-12-26: qty 2

## 2015-12-26 MED ORDER — LIDOCAINE HCL (CARDIAC) 20 MG/ML IV SOLN
INTRAVENOUS | Status: DC | PRN
  Administered 2015-12-26: 100 mg via INTRAVENOUS

## 2015-12-26 MED ORDER — NALOXONE HCL 0.4 MG/ML IJ SOLN: 0.4000 mg | INTRAMUSCULAR | Status: DC | PRN

## 2015-12-26 MED ORDER — ONDANSETRON HCL 4 MG/2ML IJ SOLN: 4.0000 mg | Freq: Four times a day (QID) | INTRAMUSCULAR | Status: DC | PRN

## 2015-12-26 MED ORDER — DOCUSATE SODIUM 100 MG PO CAPS
100.0000 mg | ORAL_CAPSULE | Freq: Two times a day (BID) | ORAL | Status: DC
  Administered 2015-12-27 (×2): 100 mg via ORAL
  Filled 2015-12-26 (×2): qty 1

## 2015-12-26 MED ORDER — DIPHENHYDRAMINE HCL 12.5 MG/5ML PO ELIX: 12.5000 mg | ORAL_SOLUTION | Freq: Four times a day (QID) | ORAL | Status: DC | PRN

## 2015-12-26 MED ORDER — KETOROLAC TROMETHAMINE 30 MG/ML IJ SOLN
30.0000 mg | Freq: Four times a day (QID) | INTRAMUSCULAR | Status: DC
  Administered 2015-12-26 – 2015-12-27 (×2): 30 mg via INTRAVENOUS
  Filled 2015-12-26 (×2): qty 1

## 2015-12-26 MED ORDER — SODIUM CHLORIDE 0.9 % IJ SOLN
INTRAMUSCULAR | Status: AC
  Filled 2015-12-26: qty 50

## 2015-12-26 MED ORDER — MENTHOL 3 MG MT LOZG: 1.0000 | LOZENGE | OROMUCOSAL | Status: DC | PRN

## 2015-12-26 MED ORDER — ROCURONIUM BROMIDE 100 MG/10ML IV SOLN
INTRAVENOUS | Status: AC
  Filled 2015-12-26: qty 1

## 2015-12-26 MED ORDER — MEPERIDINE HCL 25 MG/ML IJ SOLN: 6.2500 mg | INTRAMUSCULAR | Status: DC | PRN

## 2015-12-26 MED ORDER — HYDROMORPHONE HCL 1 MG/ML IJ SOLN
INTRAMUSCULAR | Status: DC | PRN
  Administered 2015-12-26: 1 mg via INTRAVENOUS
  Administered 2015-12-26 (×2): 0.5 mg via INTRAVENOUS

## 2015-12-26 MED ORDER — VASOPRESSIN 20 UNIT/ML IV SOLN
INTRAVENOUS | Status: AC
  Filled 2015-12-26: qty 1

## 2015-12-26 MED ORDER — ONDANSETRON HCL 4 MG/2ML IJ SOLN
INTRAMUSCULAR | Status: AC
  Filled 2015-12-26: qty 2

## 2015-12-26 MED ORDER — METOCLOPRAMIDE HCL 5 MG/ML IJ SOLN
INTRAMUSCULAR | Status: AC
  Filled 2015-12-26: qty 2

## 2015-12-26 MED ORDER — DEXAMETHASONE SODIUM PHOSPHATE 10 MG/ML IJ SOLN
INTRAMUSCULAR | Status: AC
Start: 2015-12-26 — End: 2015-12-26
  Filled 2015-12-26: qty 1

## 2015-12-26 MED ORDER — FENTANYL CITRATE (PF) 250 MCG/5ML IJ SOLN
INTRAMUSCULAR | Status: AC
  Filled 2015-12-26: qty 5

## 2015-12-26 MED ORDER — PROPOFOL 10 MG/ML IV BOLUS
INTRAVENOUS | Status: DC | PRN
  Administered 2015-12-26: 200 mg via INTRAVENOUS

## 2015-12-26 MED ORDER — SCOPOLAMINE 1 MG/3DAYS TD PT72
1.0000 | MEDICATED_PATCH | Freq: Once | TRANSDERMAL | Status: DC
  Administered 2015-12-26: 1.5 mg via TRANSDERMAL

## 2015-12-26 MED ORDER — METOCLOPRAMIDE HCL 5 MG/ML IJ SOLN
INTRAMUSCULAR | Status: DC | PRN
  Administered 2015-12-26: 10 mg via INTRAVENOUS

## 2015-12-26 MED ORDER — HYDROMORPHONE HCL 1 MG/ML IJ SOLN
0.2500 mg | INTRAMUSCULAR | Status: DC | PRN
  Administered 2015-12-26 (×3): 0.5 mg via INTRAVENOUS

## 2015-12-26 MED ORDER — FENTANYL CITRATE (PF) 100 MCG/2ML IJ SOLN
INTRAMUSCULAR | Status: AC
  Filled 2015-12-26: qty 4

## 2015-12-26 MED ORDER — IBUPROFEN 600 MG PO TABS
600.0000 mg | ORAL_TABLET | Freq: Four times a day (QID) | ORAL | Status: DC | PRN
  Administered 2015-12-27: 600 mg via ORAL
  Filled 2015-12-26: qty 1

## 2015-12-26 MED ORDER — VASOPRESSIN 20 UNIT/ML IV SOLN
INTRAVENOUS | Status: DC | PRN
  Administered 2015-12-26: 30 mL via INTRAMUSCULAR

## 2015-12-26 SURGICAL SUPPLY — 85 items
CABLE HIGH FREQUENCY MONO STRZ (ELECTRODE) IMPLANT
CANISTER SUCT 3000ML (MISCELLANEOUS) ×4 IMPLANT
CATH ROBINSON RED A/P 16FR (CATHETERS) IMPLANT
CLOSURE WOUND 1/4 X3 (GAUZE/BANDAGES/DRESSINGS)
CLOTH BEACON ORANGE TIMEOUT ST (SAFETY) ×4 IMPLANT
CONT PATH 16OZ SNAP LID 3702 (MISCELLANEOUS) ×4 IMPLANT
COVER BACK TABLE 60X90IN (DRAPES) ×4 IMPLANT
DECANTER SPIKE VIAL GLASS SM (MISCELLANEOUS) ×8 IMPLANT
DRAIN JACKSON PRT FLT 7MM (DRAIN) IMPLANT
DRAPE SHEET LG 3/4 BI-LAMINATE (DRAPES) ×8 IMPLANT
DRAPE WARM FLUID 44X44 (DRAPE) IMPLANT
DRSG OPSITE POSTOP 3X4 (GAUZE/BANDAGES/DRESSINGS) IMPLANT
DRSG OPSITE POSTOP 4X10 (GAUZE/BANDAGES/DRESSINGS) ×4 IMPLANT
DURAPREP 26ML APPLICATOR (WOUND CARE) ×4 IMPLANT
ELECT CAUTERY BLADE 6.4 (BLADE) IMPLANT
ELECT NEEDLE TIP 2.8 STRL (NEEDLE) IMPLANT
ELECT REM PT RETURN 9FT ADLT (ELECTROSURGICAL) ×4
ELECTRODE REM PT RTRN 9FT ADLT (ELECTROSURGICAL) ×2 IMPLANT
EVACUATOR SILICONE 100CC (DRAIN) IMPLANT
FILTER SMOKE EVAC LAPAROSHD (FILTER) ×4 IMPLANT
FORCEPS CUTTING 33CM 5MM (CUTTING FORCEPS) IMPLANT
FORCEPS CUTTING 45CM 5MM (CUTTING FORCEPS) IMPLANT
GAUZE PACKING 2X5 YD STRL (GAUZE/BANDAGES/DRESSINGS) ×4 IMPLANT
GAUZE SPONGE 4X4 16PLY XRAY LF (GAUZE/BANDAGES/DRESSINGS) IMPLANT
GLOVE BIOGEL PI IND STRL 6.5 (GLOVE) ×2 IMPLANT
GLOVE BIOGEL PI IND STRL 7.0 (GLOVE) ×2 IMPLANT
GLOVE BIOGEL PI INDICATOR 6.5 (GLOVE) ×2
GLOVE BIOGEL PI INDICATOR 7.0 (GLOVE) ×2
GLOVE SURG SS PI 6.5 STRL IVOR (GLOVE) ×12 IMPLANT
GOWN STRL REUS W/TWL LRG LVL3 (GOWN DISPOSABLE) ×12 IMPLANT
LEGGING LITHOTOMY PAIR STRL (DRAPES) ×4 IMPLANT
LIQUID BAND (GAUZE/BANDAGES/DRESSINGS) ×4 IMPLANT
NEEDLE HYPO 22GX1.5 SAFETY (NEEDLE) IMPLANT
NEEDLE MAYO CATGUT SZ4 (NEEDLE) ×4 IMPLANT
NEEDLE SPNL 22GX3.5 QUINCKE BK (NEEDLE) ×4 IMPLANT
NS IRRIG 1000ML POUR BTL (IV SOLUTION) ×4 IMPLANT
PACK ABDOMINAL GYN (CUSTOM PROCEDURE TRAY) IMPLANT
PACK LAVH (CUSTOM PROCEDURE TRAY) ×4 IMPLANT
PACK ROBOTIC GOWN (GOWN DISPOSABLE) ×4 IMPLANT
PACK TRENDGUARD 450 HYBRID PRO (MISCELLANEOUS) ×2 IMPLANT
PACK TRENDGUARD 600 HYBRD PROC (MISCELLANEOUS) IMPLANT
PAD MAGNETIC INST (MISCELLANEOUS) ×4 IMPLANT
PAD OB MATERNITY 4.3X12.25 (PERSONAL CARE ITEMS) ×4 IMPLANT
PENCIL SMOKE EVAC W/HOLSTER (ELECTROSURGICAL) ×4 IMPLANT
PROTECTOR NERVE ULNAR (MISCELLANEOUS) IMPLANT
SET IRRIG TUBING LAPAROSCOPIC (IRRIGATION / IRRIGATOR) IMPLANT
SHEARS HARMONIC ACE PLUS 36CM (ENDOMECHANICALS) IMPLANT
SLEEVE XCEL OPT CAN 5 100 (ENDOMECHANICALS) ×4 IMPLANT
SOLUTION ELECTROLUBE (MISCELLANEOUS) IMPLANT
SPONGE LAP 18X18 X RAY DECT (DISPOSABLE) IMPLANT
STAPLER VISISTAT 35W (STAPLE) IMPLANT
STRIP CLOSURE SKIN 1/4X3 (GAUZE/BANDAGES/DRESSINGS) IMPLANT
SUT CHROMIC 2 0 TIES 18 (SUTURE) IMPLANT
SUT MNCRL AB 3-0 PS2 27 (SUTURE) ×4 IMPLANT
SUT PDS AB 1 CT  36 (SUTURE)
SUT PDS AB 1 CT 36 (SUTURE) IMPLANT
SUT PLAIN 2 0 XLH (SUTURE) IMPLANT
SUT SILK 0 FSL (SUTURE) IMPLANT
SUT VIC AB 0 CT1 18XCR BRD8 (SUTURE) ×6 IMPLANT
SUT VIC AB 0 CT1 27 (SUTURE) ×4
SUT VIC AB 0 CT1 27XBRD ANBCTR (SUTURE) ×2 IMPLANT
SUT VIC AB 0 CT1 27XCR 8 STRN (SUTURE) ×2 IMPLANT
SUT VIC AB 0 CT1 8-18 (SUTURE) ×6
SUT VIC AB 2-0 CT1 (SUTURE) ×4 IMPLANT
SUT VIC AB 3-0 PS2 18 (SUTURE) ×2
SUT VIC AB 3-0 PS2 18XBRD (SUTURE) ×2 IMPLANT
SUT VIC AB 3-0 SH 27 (SUTURE) ×2
SUT VIC AB 3-0 SH 27X BRD (SUTURE) ×2 IMPLANT
SUT VICRYL 0 ENDOLOOP (SUTURE) IMPLANT
SUT VICRYL 0 TIES 12 18 (SUTURE) ×4 IMPLANT
SUT VICRYL 0 UR6 27IN ABS (SUTURE) ×8 IMPLANT
SYR 20CC LL (SYRINGE) IMPLANT
SYR 50ML LL SCALE MARK (SYRINGE) IMPLANT
SYR CONTROL 10ML LL (SYRINGE) IMPLANT
SYR TB 1ML 25GX5/8 (SYRINGE) ×4 IMPLANT
SYR TB 1ML LUER SLIP (SYRINGE) ×4 IMPLANT
TOWEL OR 17X24 6PK STRL BLUE (TOWEL DISPOSABLE) ×8 IMPLANT
TRAY FOLEY CATH SILVER 14FR (SET/KITS/TRAYS/PACK) ×4 IMPLANT
TRENDGUARD 450 HYBRID PRO PACK (MISCELLANEOUS) ×4
TRENDGUARD 600 HYBRID PROC PK (MISCELLANEOUS)
TROCAR BALL TOP DISP 5MM (ENDOMECHANICALS) ×4 IMPLANT
TROCAR OPTI TIP 5M 100M (ENDOMECHANICALS) ×4 IMPLANT
TROCAR XCEL DIL TIP R 11M (ENDOMECHANICALS) ×4 IMPLANT
WARMER LAPAROSCOPE (MISCELLANEOUS) ×4 IMPLANT
WATER STERILE IRR 1000ML POUR (IV SOLUTION) ×4 IMPLANT

## 2015-12-26 NOTE — H&P (Signed)
History and Physical Interval Note:   12/26/2015   10:51 AM   Vickie Elliott  has presented today for surgery, with the diagnosis of endometriosis,  Pelvic and Perineal Pain.  The various methods of treatment have been discussed with the patient and family. After consideration of risks, benefits and other options for treatment, the patient has consented to  Procedure(s): LAPAROSCOPIC ASSISTED VAGINAL HYSTERECTOMY WITH BILATERAL SALPINGECTOMY, POSSIBLE OOPHORECTOMY, POSSIBLE HYSTERECTOMY ABDOMINAL WITH SALPINGECTOMY as a surgical intervention .  I have reviewed the patients' chart and labs.  Questions were answered to the patient's satisfaction.     Hal MoralesHAYGOOD,[name] P  MD

## 2015-12-26 NOTE — Anesthesia Postprocedure Evaluation (Signed)
Anesthesia Post Note  Patient: Vickie Elliott  Procedure(s) Performed: Procedure(s) (LRB): LAPAROSCOPY, VAGINAL HYSTERECTOMY WITH BILATERAL SALPINGECTOMY  (Bilateral)  Patient location during evaluation: PACU Anesthesia Type: General Level of consciousness: awake and alert Pain management: pain level controlled Vital Signs Assessment: post-procedure vital signs reviewed and stable Respiratory status: spontaneous breathing, nonlabored ventilation, respiratory function stable and patient connected to nasal cannula oxygen Cardiovascular status: blood pressure returned to baseline and stable Postop Assessment: no signs of nausea or vomiting Anesthetic complications: no     Last Vitals:  Vitals:   12/26/15 1650 12/26/15 1700  BP: 111/61 119/62  Pulse: 76 60  Resp: 16 13  Temp: 36.6 C     Last Pain:  Vitals:   12/26/15 1650  TempSrc: Oral  PainSc:    Pain Goal: Patients Stated Pain Goal: 3 (12/26/15 1600)               Kennieth RadFitzgerald, Vergie Zahm E

## 2015-12-26 NOTE — Progress Notes (Signed)
Day of Surgery Procedure(s) (LRB): LAPAROSCOPY, VAGINAL HYSTERECTOMY WITH BILATERAL SALPINGECTOMY  (Bilateral)  Subjective: Patient reports no  Nausea or  Vomiting.  Incisional pain well controlled with PCA,  and tolerating PO.    Objective: I have reviewed patient's vital signs, intake and output and medications.  General: alert, cooperative and no distress Resp: clear to auscultation bilaterally Cardio: regular rate and rhythm, S1, S2 normal, no murmur, click, rub or gallop GI: soft, non-tender; bowel sounds normal; no masses,  no organomegaly.  Dressing stable Extremities: extremities normal, atraumatic, no cyanosis or edema Vaginal Bleeding: none  Assessment: s/p Procedure(s) with comments: LAPAROSCOPY, VAGINAL HYSTERECTOMY WITH BILATERAL SALPINGECTOMY  (Bilateral) - 3 Hours: stable, progressing well and tolerating diet  Plan: Advance diet Encourage ambulation Advance to PO medication if tolerated tonight Discharge home in am  LOS: 0 days    Elliott,Vickie P 12/26/2015, 9:10 PM

## 2015-12-26 NOTE — Transfer of Care (Signed)
Immediate Anesthesia Transfer of Care Note  Patient: Vickie Elliott  Procedure(s) Performed: Procedure(s) with comments: LAPAROSCOPY, VAGINAL HYSTERECTOMY WITH BILATERAL SALPINGECTOMY  (Bilateral) - 3 Hours  Patient Location: PACU  Anesthesia Type:General  Level of Consciousness: awake, alert  and oriented  Airway & Oxygen Therapy: Patient Spontanous Breathing and Patient connected to nasal cannula oxygen  Post-op Assessment: Report given to RN and Post -op Vital signs reviewed and stable  Post vital signs: Reviewed and stable  Last Vitals:  Vitals:   12/26/15 1023  BP: 118/75  Pulse: 89  Resp: 18  Temp: 36.6 C    Last Pain:  Vitals:   12/26/15 1023  TempSrc: Oral  PainSc: 7       Patients Stated Pain Goal: 3 (12/26/15 1023)  Complications: No apparent anesthesia complications

## 2015-12-26 NOTE — OR Nursing (Signed)
SPECIMEN WEIGHT = 97.7 GRAMS

## 2015-12-26 NOTE — Discharge Instructions (Signed)
Call [facility] @ [phone] if:  You have a temperature greater than or equal to 100.4 degrees Farenheit orally You have pain that is not made better by the pain medication given and taken as directed You have excessive bleeding or problems urinating  Take Colace (Docusate Sodium/Stool Softener) 100 mg 2-3 times daily while taking narcotic pain medicine to avoid constipation or until bowel movements are regular.  You may drive after 2 weeks You may walk up steps  You may shower  You may resume a regular diet  Keep incisions clean and dry with honeycomb dressing on until 01/02/2016 Do not lift over 15 pounds for 6 weeks Avoid anything in vagina for 6 weeks (or until after your post-operative visit)

## 2015-12-26 NOTE — Op Note (Signed)
OPERATIVE REPORT   INDICATIONS:abnormal uterine bleeding, pelvic pain and endometriosis  PRE-OP DIAGNOSIS:Pelvic and Perineal Pain   POST-OP DIAGNOSIS;Pelvic and Perineal Pain ENDOMETRIOSIS   PROCEDURE:Procedure(s) (LRB): LAPAROSCOPY, VAGINAL HYSTERECTOMY WITH BILATERAL SALPINGECTOMY  (Bilateral)   SURGEON:Israel Werts P  ASSIST:Elmira Lowell GuitarPowell PA-C  SPECIMENS:  Uterus, cervix and bilateral tubes  DISPOSITION OF SPECIMEN:Delivered to Histology  EBL:150cc  COMPLICATIONS: none  FINDINGS:  The uterus was normal sized. The posterior surface over the left uterosacral ligament was covered with erythematous blebs consistent with endometriosis. The posterior peritoneum had no evidence of adhesions, but a single area of peritoneal window. The tubes and ovaries bilaterally were normal without evidence of adhesions or other stigmata of endometriosis. The anterior cul-de-sac was normal.  PATIENT TO:  PACU - hemodynamically stable.  PROCEDURE DETAILS: The patient was taken to the operating room after appropriate identification and placed on the operating table.  After the attainment of adequate general anesthesia the patient was placed in the modified lithotomy position.  A time out was held. The perineum and vagina were prepped with multiple layers of Betadine. A Foley catheter was inserted into the bladder and connected to straight drainage.  A Hulka tenaculum was placed on the cervix.  The abdomen was prepped with ChloraPrep.  The abdomen and perineum were draped as a sterile field.  Subumbilical and suprapubic injection of quarter percent Marcaine was undertaken for total of 10 cc a subumbilical incision was made and taken down to the fascia bluntly. The fascia was incised and the fascial edges held with sutures of 0 Vicryl. The peritoneum was entered and the [facility] cannula placed into the peritoneal cavity under direct visualization. A pneumoperitoneum was created. The laparoscope was placed  through the trocar sleeve. A suprapubic incision to the left of midline was made and a 5mm laparoscopic probe trocar placed through that incision into the peritoneal cavity under direct visualization.  The below noted findings were made. A decision was made to proceed with total vaginal hysterectomy. The pneumoperitoneum was produced and the laparoscope removed from the trocar sleeve. The perineum was then made. The site of operation.   A weighted speculum was placed in the posterior vagina and Lahey tenaculae were placed on the anterior and posterior surfaces of the cervix. The cervicovaginal mucosa was injected with a dilute solution of Pitressin. The cervix was circumscribed. The anterior vaginal mucosa wasbluntly dissected off the anterior cervix and the anterior peritoneum entered. The bladder blade was placed and the bladder elevated. The posterior peritoneum was entered sharply and tagged. Uterosacral ligaments on the right and left side were clamped cut and suture ligated, and the sutures held. The paracervical tissues were then clamped cut and suture ligated. The uterine arteries were clamped cut and suture ligated. The parametral tissues were clamped, cut, and suture ligated.The uterus was inverted and the upper pedicles clamped cut tied with free tie and suture-ligated. The uterus and cervix were removed from the operative field.  The fallopian tube on the left side was grasped with a Babcock clamp and the mesial salpinx clamped with a Tresa EndoKelly. Tube was excised. The mesial salpinx was tied fore and aft with 2 sutures of 0 Vicryl. A similar procedure was carried out on the opposite side. Hemostasis was noted to be adequate.  The McCall culdoplasty sutures were then placed incorporating the uterosacral ligaments on either side, and the intervening peritoneum.  These were held. The vaginal angles were created with the sutures from the  uterosacral ligaments that had been held  and placed through the  anterior and posterior surfaces of the vagina then tied down. The remainder of the vaginal cuff was closed with figure-of-eight sutures of.  All sutures used were 0 Vicryl.  The McCall culdoplasty sutures were then tied down. A 2 inch plain vaginal packing moistened with Estrace cream was placed in the vagina.   The surgeon returned to the abdomen after changing gown and gloves and a pneumoperitoneum was re-created. The laparoscope was placed through the trocar sleeve and evaluation of the pelvis revealed adequate hemostasis. 20 cc of quarter percent Marcaine was left in the pelvis. All instruments were then removed from the peritoneal cavity under direct visualization. The sutures which held the fascia were used to create figure-of-eight sutures for fascial closure. The skin closure of the subumbilical incision was with subcuticular sutures of 3-0 Monocryl. The suprapubic incision was reapproximated with Dermabond. Sterile dressing was applied to the subumbilical incision. The patient was awakened from general anesthesia and taken to the recovery room in satisfactory condition having tolerated the procedure well the sponge and instrument counts correct.  Hal MoralesHAYGOOD,Hephzibah Strehle P, MD 1:48 PM

## 2015-12-26 NOTE — Anesthesia Preprocedure Evaluation (Signed)
Anesthesia Evaluation  Patient identified by MRN, date of birth, ID band Patient awake    Reviewed: Allergy & Precautions, NPO status , Patient's Chart, lab work & pertinent test results  Airway Mallampati: III  TM Distance: >3 FB Neck ROM: Full    Dental no notable dental hx. (+) Teeth Intact   Pulmonary neg pulmonary ROS,    Pulmonary exam normal breath sounds clear to auscultation       Cardiovascular negative cardio ROS Normal cardiovascular exam Rhythm:Regular Rate:Normal     Neuro/Psych  Headaches, negative psych ROS   GI/Hepatic negative GI ROS, Neg liver ROS,   Endo/Other  negative endocrine ROS  Renal/GU negative Renal ROS  negative genitourinary   Musculoskeletal negative musculoskeletal ROS (+)   Abdominal   Peds  Hematology negative hematology ROS (+)   Anesthesia Other Findings   Reproductive/Obstetrics Pelvic pain Endometriosis Dysmenorrhea Menorrhagia                              Lab Results  Component Value Date   WBC 10.5 12/13/2015   HGB 12.2 12/13/2015   HCT 35.6 (L) 12/13/2015   MCV 89.9 12/13/2015   PLT 341 12/13/2015    Anesthesia Physical Anesthesia Plan  ASA: II  Anesthesia Plan: General   Post-op Pain Management:    Induction: Intravenous  Airway Management Planned: Oral ETT  Additional Equipment:   Intra-op Plan:   Post-operative Plan: Extubation in OR  Informed Consent:   Plan Discussed with: CRNA, Anesthesiologist and Surgeon  Anesthesia Plan Comments:         Anesthesia Quick Evaluation

## 2015-12-26 NOTE — Anesthesia Procedure Notes (Signed)
Procedure Name: Intubation Date/Time: 12/26/2015 11:17 AM Performed by: Junious SilkGILBERT, Najib Colmenares Pre-anesthesia Checklist: Patient identified, Emergency Drugs available, Suction available, Patient being monitored and Timeout performed Patient Re-evaluated:Patient Re-evaluated prior to inductionOxygen Delivery Method: Circle system utilized Preoxygenation: Pre-oxygenation with 100% oxygen Intubation Type: IV induction Ventilation: Mask ventilation without difficulty Laryngoscope Size: Miller and 2 Grade View: Grade I Tube type: Oral Tube size: 7.0 mm Number of attempts: 1 Airway Equipment and Method: Stylet Placement Confirmation: ETT inserted through vocal cords under direct vision,  CO2 detector,  breath sounds checked- equal and bilateral and positive ETCO2 Secured at: 21 cm Tube secured with: Tape Dental Injury: Teeth and Oropharynx as per pre-operative assessment

## 2015-12-27 ENCOUNTER — Encounter (HOSPITAL_COMMUNITY): Payer: Self-pay | Admitting: Obstetrics and Gynecology

## 2015-12-27 DIAGNOSIS — N920 Excessive and frequent menstruation with regular cycle: Secondary | ICD-10-CM | POA: Diagnosis not present

## 2015-12-27 LAB — CBC
HEMATOCRIT: 30.2 % — AB (ref 36.0–46.0)
Hemoglobin: 10.3 g/dL — ABNORMAL LOW (ref 12.0–15.0)
MCH: 30.8 pg (ref 26.0–34.0)
MCHC: 34.1 g/dL (ref 30.0–36.0)
MCV: 90.4 fL (ref 78.0–100.0)
PLATELETS: 300 10*3/uL (ref 150–400)
RBC: 3.34 MIL/uL — ABNORMAL LOW (ref 3.87–5.11)
RDW: 13.7 % (ref 11.5–15.5)
WBC: 14.5 10*3/uL — AB (ref 4.0–10.5)

## 2015-12-27 MED ORDER — IBUPROFEN 600 MG PO TABS
ORAL_TABLET | ORAL | 1 refills | Status: DC
Start: 2015-12-27 — End: 2021-09-04

## 2015-12-27 MED ORDER — ONDANSETRON HCL 4 MG PO TABS: 4.0000 mg | ORAL_TABLET | Freq: Three times a day (TID) | ORAL | 0 refills | Status: AC | PRN

## 2015-12-27 MED ORDER — OXYCODONE-ACETAMINOPHEN 5-325 MG PO TABS: 1.0000 | ORAL_TABLET | Freq: Four times a day (QID) | ORAL | 0 refills | Status: AC | PRN

## 2015-12-27 NOTE — Discharge Summary (Signed)
Physician Discharge Summary  Patient ID: Vickie Elliott MRN: [id number] DOB/AGE: 30/07/1985 30 y.o.  Admit date: 12/26/2015 Discharge date: 12/27/2015   Discharge Diagnoses:  Endometriosis, Chronic Pelvic Pain and Menorrhagia Active Problems:   Endometriosis of pelvis   Chronic pelvic pain in female   Operation: LAPAROSCOPY, VAGINAL HYSTERECTOMY WITH BILATERAL SALPINGECTOMY     Discharged Condition: Good  Hospital Course: On the date of admission the patient underwent the aforementioned procedures and tolerated them well.  Post operative course was unremarkable with the patient resuming bowel and bladder function by post operative day #1 and was therefore deemed ready for discharge home.  Discharge hemoglobin was 10.3.  Disposition: 01-Home or Self Care  Discharge Medications:    Medication List    STOP taking these medications   MICROGESTIN 1-20 MG-MCG tablet Generic drug:  norethindrone-ethinyl estradiol     TAKE these medications   ibuprofen 600 MG tablet Commonly known as:  ADVIL,MOTRIN 1  po  pc every 6 hours for 5 days then prn-pain   ondansetron 4 MG tablet Commonly known as:  ZOFRAN Take 1 tablet (4 mg total) by mouth every 8 (eight) hours as needed for nausea or vomiting.   oxyCODONE-acetaminophen 5-325 MG tablet Commonly known as:  PERCOCET/ROXICET Take 1-2 tablets by mouth every 6 (six) hours as needed for severe pain (moderate to severe pain (when tolerating fluids)).        Follow-up:  Dr. Erie NoeVanessa P. Haygood on February 06, 2016 at 1 p.m.   SignedHenreitta Leber: Tamberlyn Midgley , PA-C 12/27/2015, 6:45 AM

## 2015-12-27 NOTE — Progress Notes (Signed)
Patient discharged home with husband. Discharge paperwork and instructions given. Prescriptions given to patient. No questions at this time.

## 2015-12-27 NOTE — Progress Notes (Cosign Needed)
Juanda Bondameka Carignan is a30 y.o.  [id number]  Post Op Date # 1: LAPAROSCOPY, VAGINAL HYSTERECTOMY WITH BILATERAL SALPINGECTOMY    Subjective: Patient is Doing well postoperatively. Patient has Pain is controlled with current analgesics. Medications being used: prescription NSAID's including Ketorolac 30 mg IV and narcotic analgesics including PCA Hydromorphone. Tolerating liquids and  crackers without nausea;  ambulating in the halls without dizziness and hasn't voided since Foley was removed 30 minutes ago. No flatus.  Objective: Vital signs in last 24 hours: Temp:  [97.3 F (36.3 C)-98.6 F (37 C)] 97.9 F (36.6 C) (11/07 0527) Pulse Rate:  [60-102] 68 (11/07 0527) Resp:  [12-21] 18 (11/07 0552) BP: (96-129)/(61-81) 96/65 (11/07 0527) SpO2:  [98 %-100 %] 100 % (11/07 0552)  Intake/Output from previous day: 11/06 0701 - 11/07 0700 In: 4930 [P.O.:2130; I.V.:2800] Out: 3350 [Urine:3200] Intake/Output this shift: Total I/O In: 2130 [P.O.:2130] Out: 2650 [Urine:2650]  Recent Labs Lab 12/27/15 0528  WBC 14.5*  HGB 10.3*  HCT 30.2*  PLT 300    No results for input(s): NA, K, CL, CO2, BUN, CREATININE, CALCIUM, PROT, BILITOT, ALKPHOS, ALT, AST, GLUCOSE in the last 168 hours.  Invalid input(s): LABALBU  EXAM: General: alert, cooperative, no distress and completing a phone conversation. Resp: clear to auscultation bilaterally Cardio: regular rate and rhythm, S1, S2 normal, no murmur, click, rub or gallop GI: Decreased bowel sounds, soft with umbilical dressing dry/intact with a dried stain in upper left quadrant; left lower abdominal incision without evidence of infection. Extremities: Homans sign is negative, no sign of DVT and SCD hose in place and functioning;  no calf tenderness. Vaginal Bleeding: Scant dried pink stain on perineal pad.   Assessment: s/p Procedure(s): LAPAROSCOPY, VAGINAL HYSTERECTOMY WITH BILATERAL SALPINGECTOMY : stable, progressing well and  anemia  Plan: Advance diet Encourage ambulation Advance to PO medication Discontinue IV fluids Routine care  LOS: 0 days    Star Cheese, PA-C 12/27/2015 6:32 AM

## 2017-01-27 IMAGING — CT CT ABD-PELV W/ CM
2 of 4 series · 16 of 46 positions shown, 18 images · IV contrast (agent unspecified)
Comparison: None.

CLINICAL DATA: Left lower quadrant pain and constipation for 4
months. History of endometriosis. Question involvement of bladder,
bowel or appendix.

EXAM:
CT ABDOMEN AND PELVIS WITH CONTRAST
TECHNIQUE: Multidetector CT imaging of the abdomen and pelvis was performed
using the standard protocol following bolus administration of
intravenous contrast.
CONTRAST:  100 cc of 6sovue-2SS

[Series 2: abd/pelvis w/cm · axial · 0.71mm/px · z∈[+586,+1001]mm · 13 of 91 slices shown, 15 images]
[im 4/91  soft-tissue]
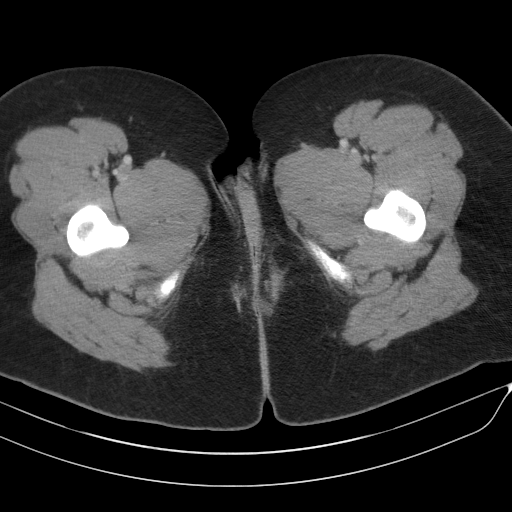
[im 4/91  bone]
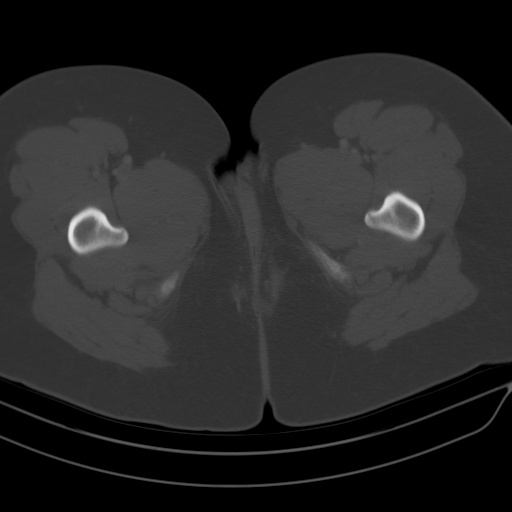
[im 12/91  soft-tissue]
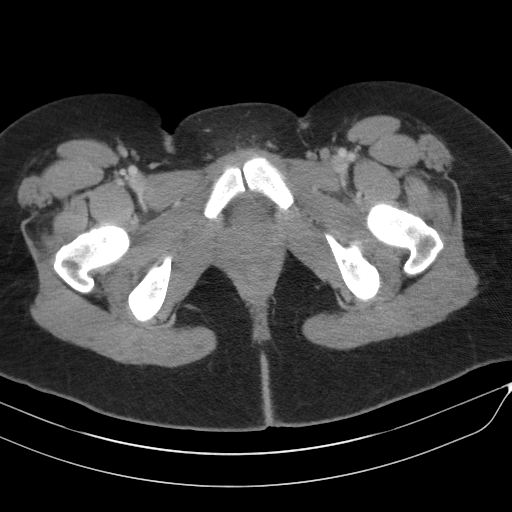
[im 20/91  soft-tissue]
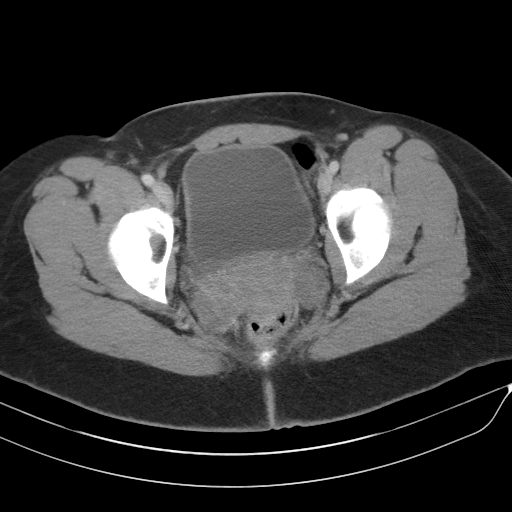
[im 24/91  soft-tissue]
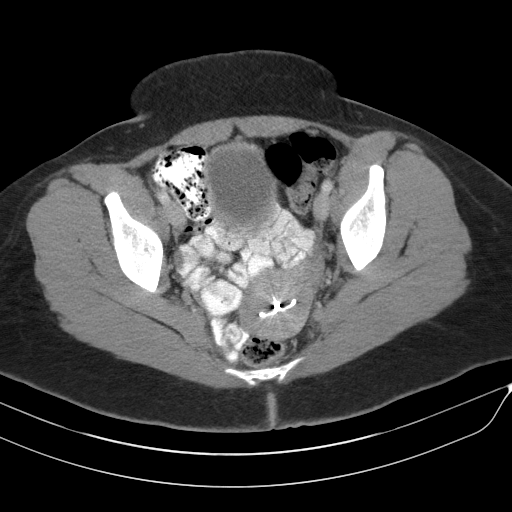
[im 32/91  soft-tissue]
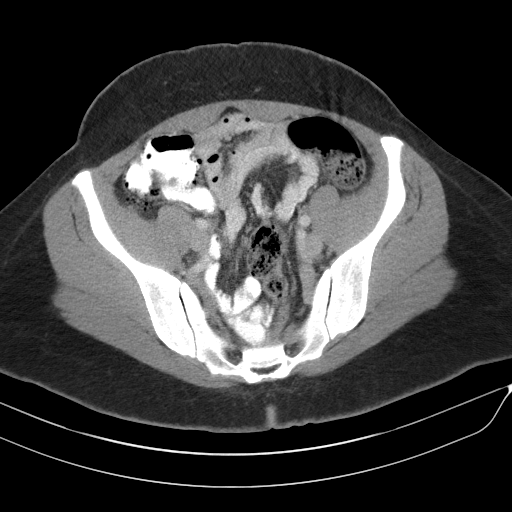
[im 40/91  soft-tissue]
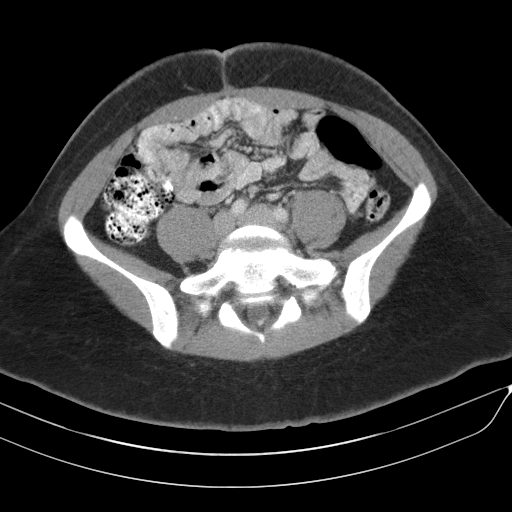
[im 47/91  soft-tissue]
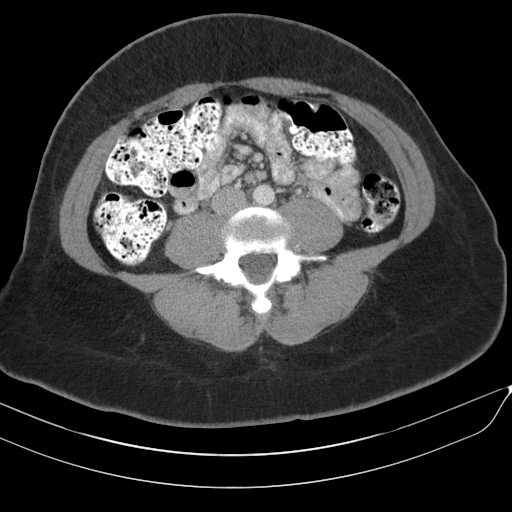
[im 51/91  soft-tissue]
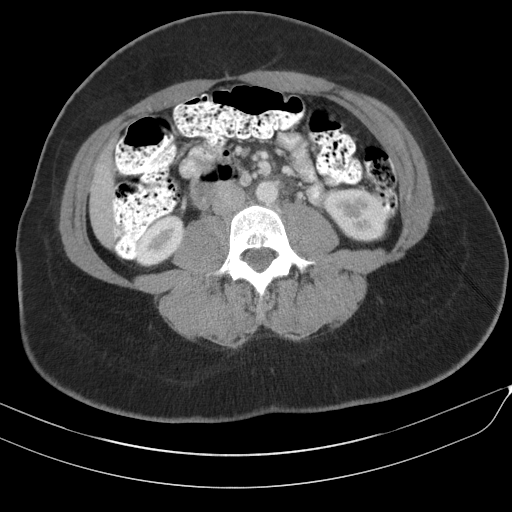
[im 59/91  soft-tissue]
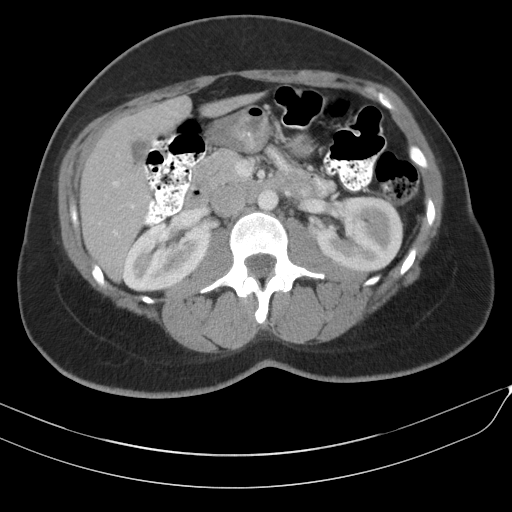
[im 59/91  bone]
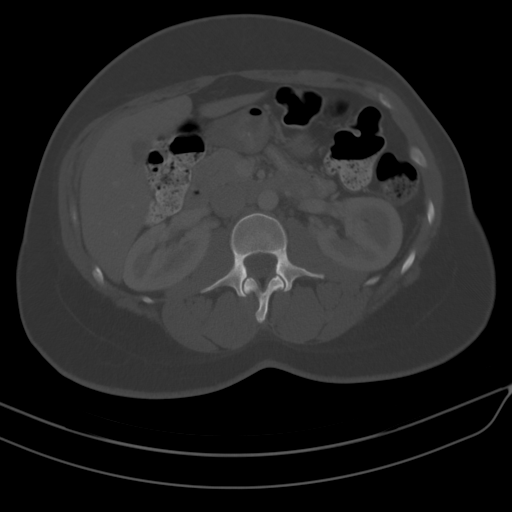
[im 67/91  soft-tissue]
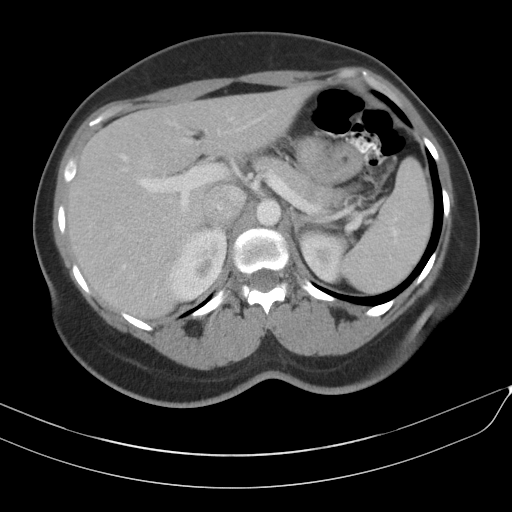
[im 71/91  soft-tissue]
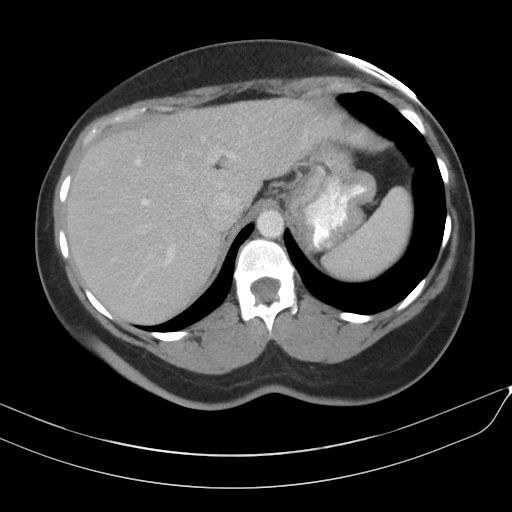
[im 79/91  soft-tissue]
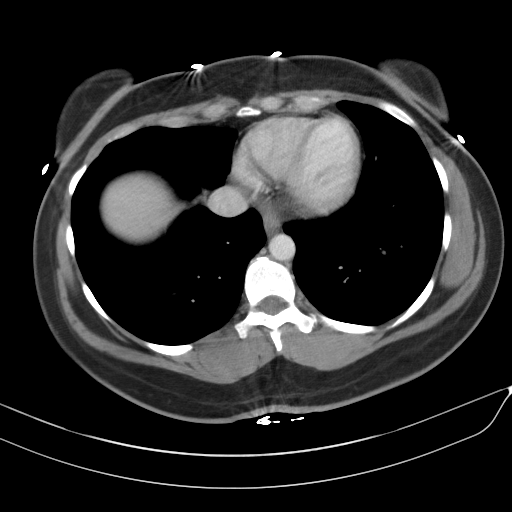
[im 87/91  soft-tissue]
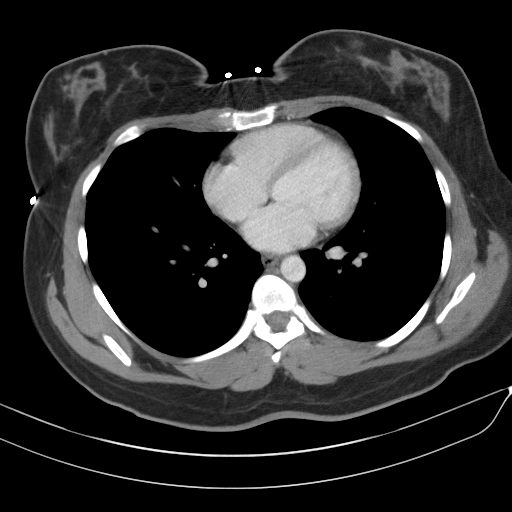

[Series 3: cor · coronal · 0.68mm/px · 3 of 95 slices shown]
[im 32/95  soft-tissue]
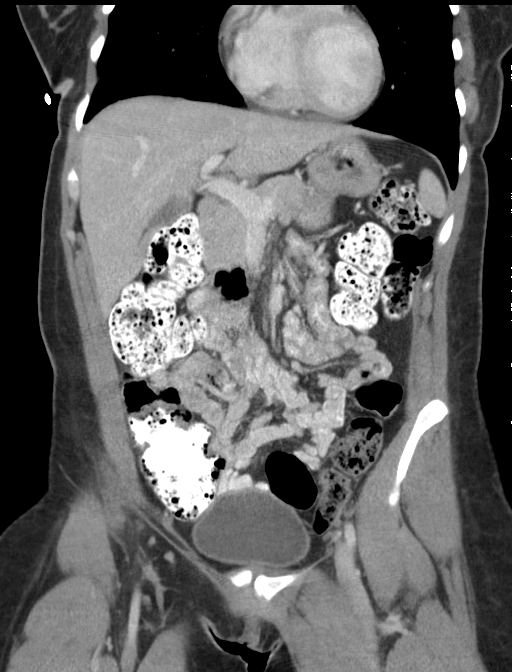
[im 42/95  soft-tissue]
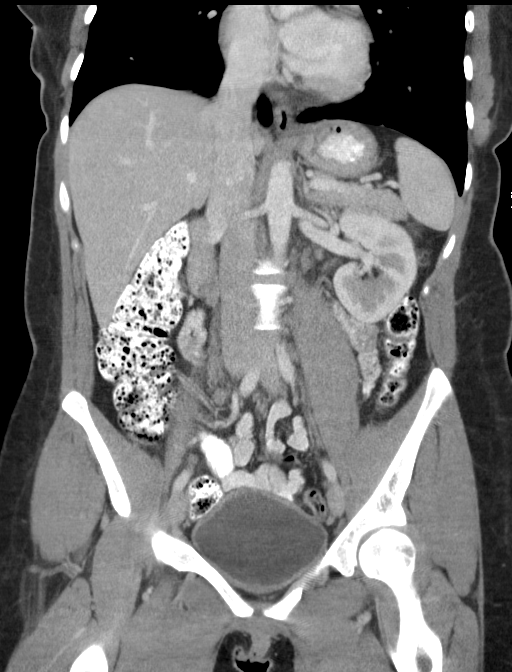
[im 53/95  soft-tissue]
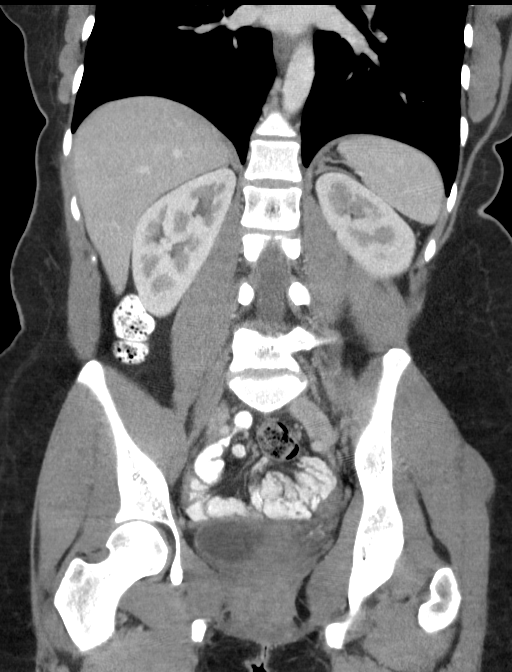

[16 of 46 positions shown; findings below may reference images not displayed]

FINDINGS: Lower chest: Clear lung bases. Normal heart size without pericardial
or pleural effusion.

Hepatobiliary: Focal steatosis adjacent the falciform ligament. No
suspicious liver lesion. Normal gallbladder, without biliary ductal
dilatation.

Pancreas: Normal, without mass or ductal dilatation.

Spleen: Normal in size, without focal abnormality.

Adrenals/Urinary Tract: Normal adrenal glands. Normal kidneys,
without hydronephrosis. Normal urinary bladder.

Stomach/Bowel: Normal stomach, without wall thickening. Colonic
stool burden suggests constipation. Normal terminal ileum and
appendix. Normal small bowel.

Vascular/Lymphatic: Normal caliber of the aorta and branch vessels.
No abdominopelvic adenopathy.

Reproductive: Retroverted uterus, with intrauterine device within.
Ovaries positioned somewhat posteriorly, without dominant ovarian
mass.

Other: No significant free fluid. No large volume intraperitoneal
implants identified. No ascites.

Musculoskeletal: Left iliac bone island.  Prominent L4-5 disc bulge.
IMPRESSION: 1. No large volume endometrial implants identified. Of note, pelvic
MRI would be of increased sensitivity secondary to improved soft
tissue contrast.
2.  Possible constipation.

## 2021-09-04 ENCOUNTER — Ambulatory Visit
Admission: EM | Admit: 2021-09-04 | Discharge: 2021-09-04 | Disposition: A | Discharge status: Home / Self Care | Payer: BC Managed Care – PPO | Attending: Emergency Medicine | Admitting: Emergency Medicine

## 2021-09-04 ENCOUNTER — Encounter: Payer: Self-pay | Admitting: Emergency Medicine

## 2021-09-04 DIAGNOSIS — M79622 Pain in left upper arm: Secondary | ICD-10-CM

## 2021-09-04 DIAGNOSIS — F430 Acute stress reaction: Secondary | ICD-10-CM | POA: Diagnosis not present

## 2021-09-04 DIAGNOSIS — F411 Generalized anxiety disorder: Secondary | ICD-10-CM

## 2021-09-04 DIAGNOSIS — M62838 Other muscle spasm: Secondary | ICD-10-CM

## 2021-09-04 MED ORDER — IBUPROFEN 600 MG PO TABS: ORAL_TABLET | ORAL | 1 refills | Status: DC

## 2021-09-04 MED ORDER — BACLOFEN 10 MG PO TABS: 10.0000 mg | ORAL_TABLET | Freq: Three times a day (TID) | ORAL | 0 refills | Status: AC

## 2021-09-04 MED ORDER — IBUPROFEN 600 MG PO TABS: ORAL_TABLET | ORAL | 1 refills | Status: AC

## 2021-09-04 NOTE — ED Triage Notes (Signed)
Pt here with 1 week hx of left sided chest tightness and aching with radiation down left arm with left arm numbness. Some SOB yesterday.

## 2021-09-04 NOTE — Discharge Instructions (Addendum)
Please begin baclofen, a muscle relaxer that also treats muscle spasm.  You can take this medication 3 times daily but I do recommend that you take it at a time.  We know you have at least 8 hours to rest.  You can also continue ibuprofen 600 mg 3 times daily as needed for pain.  I renewed that prescription for you as well.  Please consider following up with your primary care provider to discuss your stress which seems to be causing anxiety at this time.

## 2021-09-04 NOTE — ED Provider Notes (Signed)
[facility]    CSN: [id number] Arrival date & time: 09/04/21  1156    HISTORY   Chief Complaint  Patient presents with   Chest Pain   Numbness   HPI Vickie Elliott is a pleasant, 36 y.o. female who presents to urgent care today. Patient complains of a 1 week history of left-sided chest noticed with aching and radiation down the left arm along with some left hand tingling and numbness.  Patient states that yesterday she felt a little bit short of breath.  Patient has elevated diastolic blood pressure on arrival today with otherwise normal vital signs.  Patient is also well-appearing and in no acute distress at this time.  Patient reports chronic stress at her job, states she also feels that she has a lot of tightness in the muscles around her neck and the top of her shoulders.  Patient states that she has not experienced the aching and radiation down her left arm or the left hand numbing and tingling before, states she came in today to make sure that there is nothing wrong with her heart.  EKG today was unremarkable.  The history is provided by the patient.   Past Medical History:  Diagnosis Date   BV (bacterial vaginosis)    Headache(784.0)    migraines   Urinary tract infection    Vaginal yeast infection    Patient Active Problem List   Diagnosis Date Noted   Endometriosis of pelvis 12/26/2015   Chronic pelvic pain in female 12/26/2015   Dysmenorrhea 12/23/2012   Endometriosis of other specified sites 12/23/2012   Past Surgical History:  Procedure Laterality Date   COLONOSCOPY     LAPAROSCOPIC VAGINAL HYSTERECTOMY WITH SALPINGO OOPHORECTOMY Bilateral 12/26/2015   Procedure: LAPAROSCOPY, VAGINAL HYSTERECTOMY WITH BILATERAL SALPINGECTOMY ;  Surgeon: Hal Morales, MD;  Location: [facility];  Service: Gynecology;  Laterality: Bilateral;  3 Hours   LAPAROSCOPY N/A 12/23/2012   Procedure: LAPAROSCOPY OPERATIVE BIOPSY OF LEFT URETEROSCRAL LIGAMENT AND FULGERATION OF  ENDOMETRIOSIS;  Surgeon: Hal Morales, MD;  Location: [facility];  Service: Gynecology;  Laterality: N/A;   NO PAST SURGERIES     OB History   No obstetric history on file.    Home Medications    Prior to Admission medications   Medication Sig Start Date End Date Taking? Authorizing Provider  baclofen (LIORESAL) 10 MG tablet Take 1 tablet (10 mg total) by mouth 3 (three) times daily for 7 days. 09/04/21 09/11/21 Yes Theadora Rama Scales, PA-C  ibuprofen (ADVIL) 600 MG tablet 1  po  pc every 6 hours for 5 days then prn-pain 09/04/21   Theadora Rama Scales, PA-C  ondansetron (ZOFRAN) 4 MG tablet Take 1 tablet (4 mg total) by mouth every 8 (eight) hours as needed for nausea or vomiting. 12/27/15   Henreitta Leber, PA-C  oxyCODONE-acetaminophen (PERCOCET/ROXICET) 5-325 MG tablet Take 1-2 tablets by mouth every 6 (six) hours as needed for severe pain (moderate to severe pain (when tolerating fluids)). 12/27/15   Henreitta Leber, PA-C  Norgestim-Eth Estrad Triphasic (ORTHO TRI-CYCLEN LO PO) Take 1 tablet by mouth daily.    10/04/11  [provider]    Family History Family History  Problem Relation Age of Onset   Diabetes Mother    Hypertension Mother    Asthma Sister    Social History Social History   Tobacco Use   Smoking status: Never   Smokeless tobacco: Never  Substance Use Topics   Alcohol use: Yes  Comment: occ   Drug use: No   Allergies   Ciprofloxacin  Review of Systems Review of Systems Pertinent findings revealed after performing a 14 point review of systems has been noted in the history of present illness.  Physical Exam Triage Vital Signs ED Triage Vitals  Enc Vitals Group     BP 12/16/20 0827 (!) 147/82     Pulse Rate 12/16/20 0827 72     Resp 12/16/20 0827 18     Temp 12/16/20 0827 98.3 F (36.8 C)     Temp Source 12/16/20 0827 Oral     SpO2 12/16/20 0827 98 %     Weight --      Height --      Head Circumference --      Peak Flow --      Pain  Score 12/16/20 0826 5     Pain Loc --      Pain Edu? --      Excl. in [place]? --    Updated Vital Signs BP (!) 129/95   Pulse 99   Temp 98.2 F (36.8 C)   Resp 20   SpO2 98%   Physical Exam Vitals and nursing note reviewed.  Constitutional:      General: She is not in acute distress.    Appearance: Normal appearance.  HENT:     Head: Normocephalic and atraumatic.  Eyes:     Pupils: Pupils are equal, round, and reactive to light.  Cardiovascular:     Rate and Rhythm: Normal rate and regular rhythm.  Pulmonary:     Effort: Pulmonary effort is normal.     Breath sounds: Normal breath sounds.  Musculoskeletal:        General: Normal range of motion.     Right shoulder: Normal.     Left shoulder: Tenderness (Upper trapezius) present.     Cervical back: Normal range of motion and neck supple. Spasms and tenderness present. No swelling, edema or bony tenderness. No pain with movement. Normal range of motion.     Thoracic back: Normal.     Lumbar back: Normal.  Skin:    General: Skin is warm and dry.  Neurological:     General: No focal deficit present.     Mental Status: She is alert and oriented to person, place, and time. Mental status is at baseline.  Psychiatric:        Mood and Affect: Mood normal.        Behavior: Behavior normal.        Thought Content: Thought content normal.        Judgment: Judgment normal.     UC Couse / Diagnostics / Procedures:     Radiology No results found.  Procedures Procedures (including critical care time) EKG  Pending results:  Labs Reviewed - No data to display  Medications Ordered in UC: Medications - No data to display  UC Diagnoses / Final Clinical Impressions(s)   I have reviewed the triage vital signs and the nursing notes.  Pertinent labs & imaging results that were available during my care of the patient were reviewed by me and considered in my medical decision making (see chart for details).    Final diagnoses:   Cervical paraspinous muscle spasm  Anxiety in acute stress reaction    Patient is an otherwise well-appearing 36 year old female who is overweight.  Physical exam findings concerning for significant muscle spasm in left upper trapezius and surrounding cervical paraspinous muscles.  Patient politely declined offer for ketorolac injection rapid pain relief.  Patient provided with prescriptions for ibuprofen and baclofen.  Return precautions advised.   ED Prescriptions     Medication Sig Dispense Auth. Provider   baclofen (LIORESAL) 10 MG tablet Take 1 tablet (10 mg total) by mouth 3 (three) times daily for 7 days. 21 tablet Theadora Rama Scales, PA-C   ibuprofen (ADVIL) 600 MG tablet 1  po  pc every 6 hours for 5 days then prn-pain 30 tablet Theadora Rama Scales, PA-C      PDMP not reviewed this encounter.  Discharge Instructions:   Discharge Instructions      Please begin baclofen, a muscle relaxer that also treats muscle spasm.  You can take this medication 3 times daily but I do recommend that you take it at a time.  We know you have at least 8 hours to rest.  You can also continue ibuprofen 600 mg 3 times daily as needed for pain.  I renewed that prescription for you as well.  Please consider following up with your primary care provider to discuss your stress which seems to be causing anxiety at this time.    Disposition Upon Discharge:  Condition: stable for discharge home Home: take medications as prescribed; routine discharge instructions as discussed; follow up as advised.  Patient presented with an acute illness with associated systemic symptoms and significant discomfort requiring urgent management. In my opinion, this is a condition that a prudent lay person (someone who possesses an average knowledge of health and medicine) may potentially expect to result in complications if not addressed urgently such as respiratory distress, impairment of bodily function or  dysfunction of bodily organs.   Routine symptom specific, illness specific and/or disease specific instructions were discussed with the patient and/or caregiver at length.   As such, the patient has been evaluated and assessed, work-up was performed and treatment was provided in alignment with urgent care protocols and evidence based medicine.  Patient/parent/caregiver has been advised that the patient may require follow up for further testing and treatment if the symptoms continue in spite of treatment, as clinically indicated and appropriate.  Patient/parent/caregiver has been advised to report to orthopedic urgent care clinic or return to the [facility] or PCP in 3-5 days if no better; follow-up with orthopedics, PCP or the Emergency Department if new signs and symptoms develop or if the current signs or symptoms continue to change or worsen for further workup, evaluation and treatment as clinically indicated and appropriate  The patient will follow up with their current PCP if and as advised. If the patient does not currently have a PCP we will have assisted them in obtaining one.   The patient may need specialty follow up if the symptoms continue, in spite of conservative treatment and management, for further workup, evaluation, consultation and treatment as clinically indicated and appropriate.  Patient/parent/caregiver verbalized understanding and agreement of plan as discussed.  All questions were addressed during visit.  Please see discharge instructions below for further details of plan.  This office note has been dictated using Teaching laboratory technician.  Unfortunately, this method of dictation can sometimes lead to typographical or grammatical errors.  I apologize for your inconvenience in advance if this occurs.  Please do not hesitate to reach out to me if clarification is needed.      Theadora Rama Scales, [place] 09/06/21 [phone]
